# Patient Record
Sex: Male | Born: 1965 | Race: White | Hispanic: No | Marital: Married | Smoking: Never smoker
Health system: Southern US, Community
[De-identification: ages and names within clinical notes are randomized; demographics above are authoritative.]

## PROBLEM LIST (undated history)

## (undated) DIAGNOSIS — L409 Psoriasis, unspecified: Secondary | ICD-10-CM

## (undated) DIAGNOSIS — I10 Essential (primary) hypertension: Secondary | ICD-10-CM

## (undated) HISTORY — PX: CHOLECYSTECTOMY: SHX55

## (undated) HISTORY — DX: Psoriasis, unspecified: L40.9

## (undated) HISTORY — PX: APPENDECTOMY: SHX54

## (undated) HISTORY — PX: HERNIA REPAIR: SHX51

---

## 1998-02-06 ENCOUNTER — Emergency Department (HOSPITAL_COMMUNITY): Admission: EM | Admit: 1998-02-06 | Discharge: 1998-02-06 | Payer: Self-pay | Admitting: Emergency Medicine

## 1999-09-12 ENCOUNTER — Encounter: Payer: Self-pay | Admitting: Family Medicine

## 1999-09-12 ENCOUNTER — Inpatient Hospital Stay (HOSPITAL_COMMUNITY): Admission: EM | Admit: 1999-09-12 | Discharge: 1999-09-13 | Payer: Self-pay | Admitting: *Deleted

## 1999-09-12 ENCOUNTER — Encounter (INDEPENDENT_AMBULATORY_CARE_PROVIDER_SITE_OTHER): Payer: Self-pay | Admitting: Specialist

## 1999-09-12 ENCOUNTER — Encounter: Payer: Self-pay | Admitting: *Deleted

## 1999-09-16 ENCOUNTER — Encounter: Admission: RE | Admit: 1999-09-16 | Discharge: 1999-09-16 | Payer: Self-pay | Admitting: Family Medicine

## 2008-02-22 ENCOUNTER — Emergency Department: Payer: Self-pay | Admitting: Emergency Medicine

## 2008-12-27 ENCOUNTER — Emergency Department (HOSPITAL_COMMUNITY): Admission: EM | Admit: 2008-12-27 | Discharge: 2008-12-27 | Payer: Self-pay | Admitting: Emergency Medicine

## 2009-03-05 IMAGING — US US EXTREM LOW VENOUS BILAT
1 series · 17 of 24 positions shown · non-contrast
Comparison: none

REASON FOR EXAM: BILATERAL CALF PAIN; FREQUENT TRAVELER
COMMENTS:

[Series 1: us extrem low venous bilat · 17 of 79 slices shown]
[im 1/79]
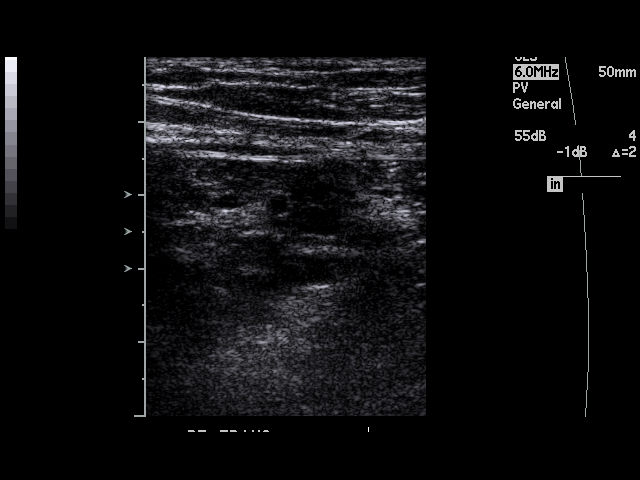
[im 7/79]
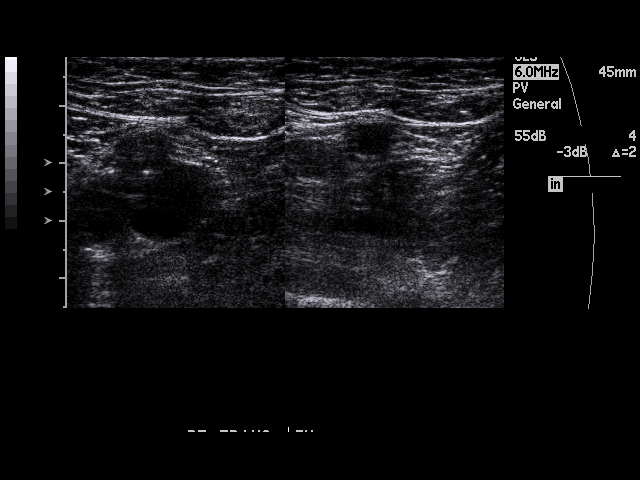
[im 11/79]
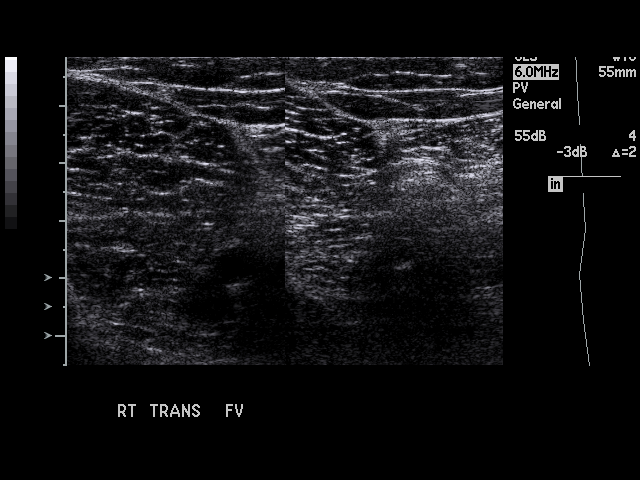
[im 14/79]
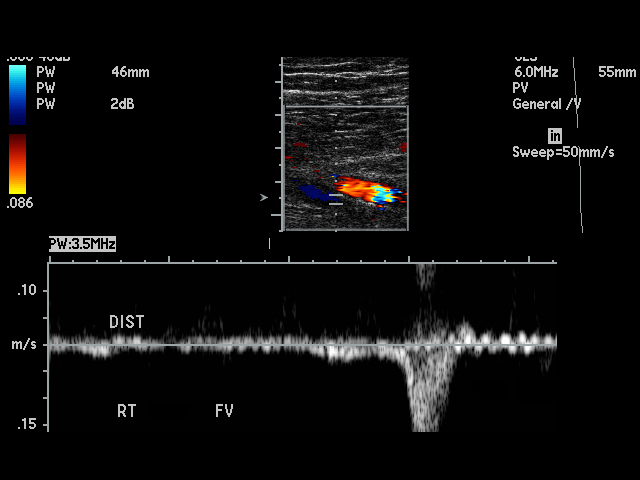
[im 21/79]
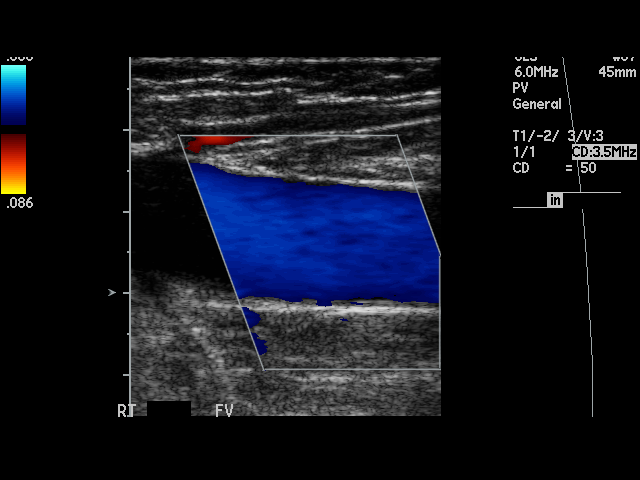
[im 24/79]
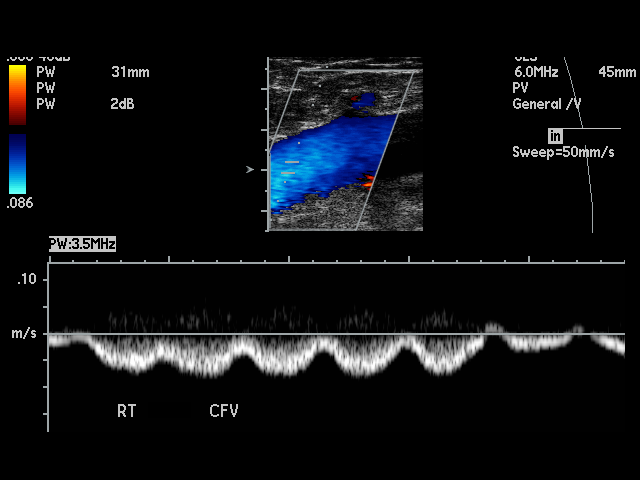
[im 31/79]
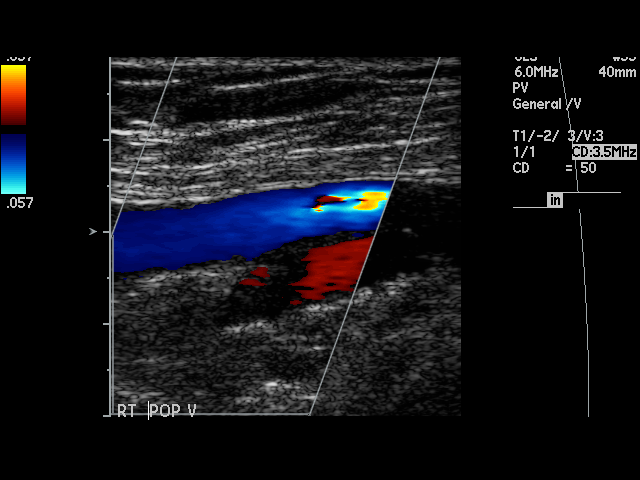
[im 34/79]
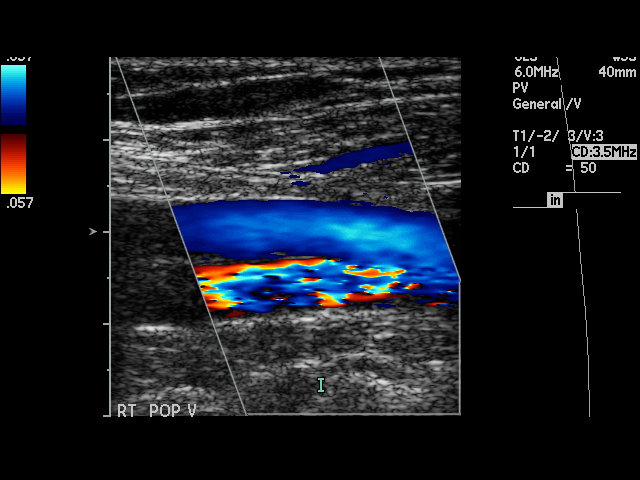
[im 41/79]
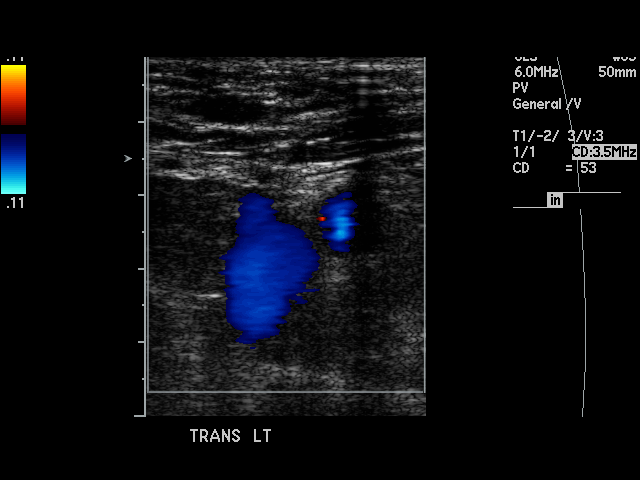
[im 45/79]
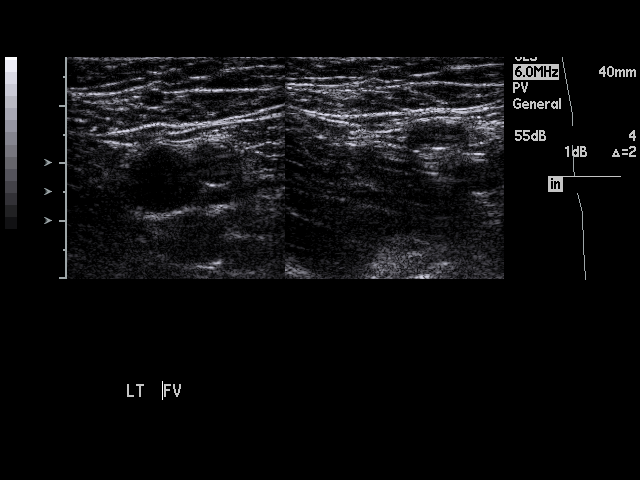
[im 48/79]
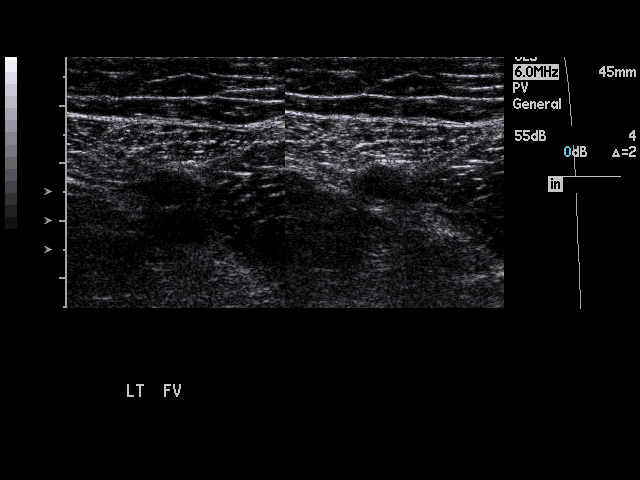
[im 55/79]
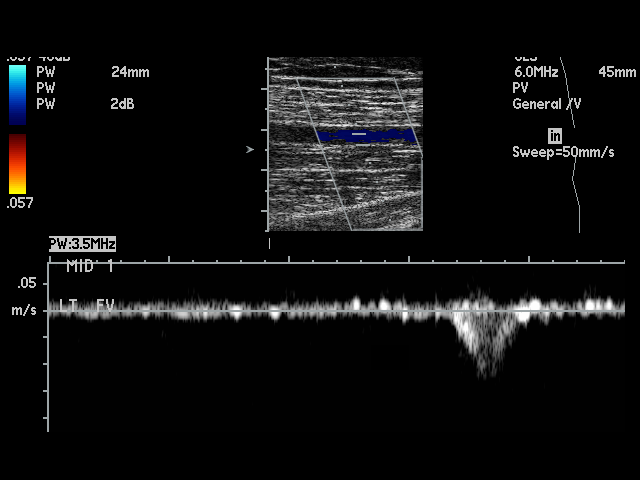
[im 58/79]
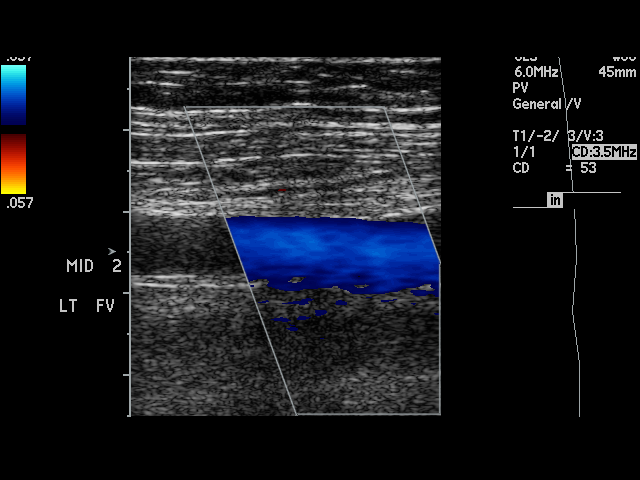
[im 65/79]
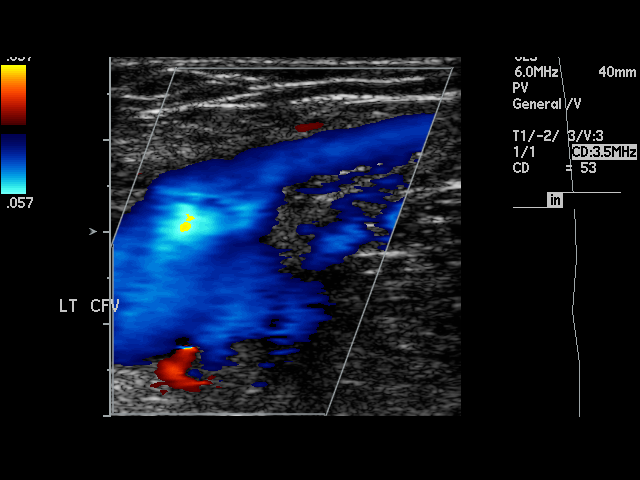
[im 68/79]
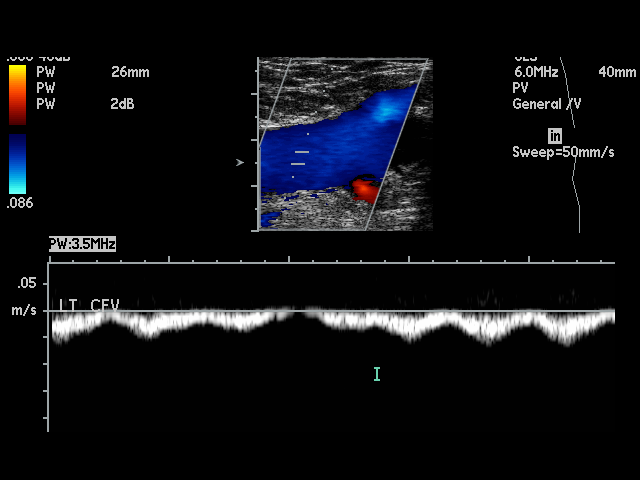
[im 72/79]
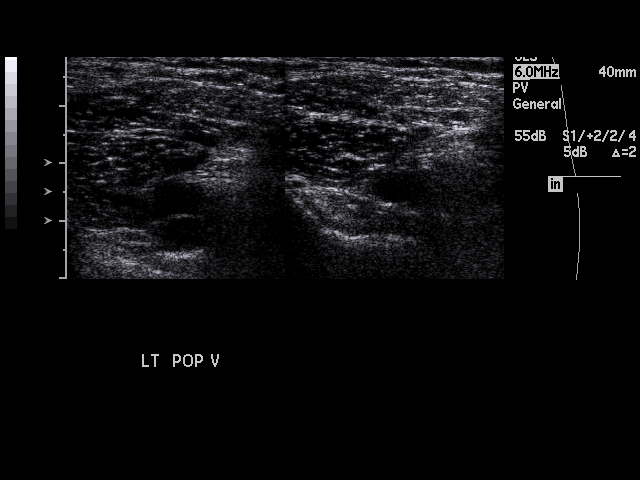
[im 79/79]
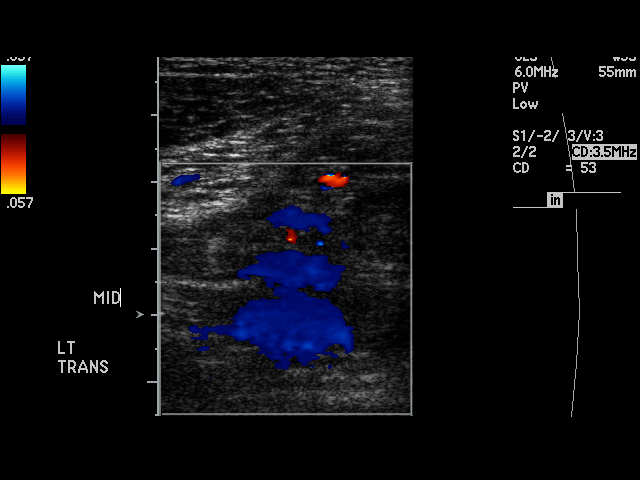

[17 of 24 positions shown; findings below may reference images not displayed]

PROCEDURE:     US  - US DOPPLER LOW EXTR BILATERAL  - February 22, 2008  [DATE]

RESULT:     Duplex, gray scale and color flow images were performed to
evaluate the deep venous structures of the RIGHT and LEFT lower extremity.

There is no evidence of increased echogenicity, non-compressibility,
abnormal color flow, gray scale or abnormal waveform within the interrogated
deep venous structures of the RIGHT and LEFT lower extremity.
IMPRESSION: 1.No sonographic evidence of a deep venous thrombus within the RIGHT and
LEFT lower extremity.

Dr. Leng of the Emergency Room was informed of these findings on 02/22/08.

## 2011-01-20 NOTE — Consult Note (Signed)
[place]  Patient:    Matthew Velasquez                        MRN: [id number] Proc. Date: 09/12/99 Adm. Date:  [id number] Attending:  McDiarmid, Leighton Roach. Dictator:   Adolph Pollack, M.D. CC:         [facility], [place], [place]                          Consultation Report  REASON FOR CONSULTATION:  Abdominal pain and gallstone.  HISTORY OF PRESENT ILLNESS:  This is a 45 year old male who has known gallstone or gallstones from CT scan a couple of years ago.  Yesterday morning he began having epigastric pain that had progressively increased throughout the day and led him to come to the emergency department early this morning.  The pain radiated through to his back and was associated with some nausea.  Questionable fever.  No jaundice. He had not had anything to eat since 8:00 p.m. last night.  He also passed a very hard stool yesterday and noticed a small amount of bright red blood per rectum.  There is no strong family history of gallbladder disease.  He has never had pain like this before.  PAST MEDICAL HISTORY: 1.  Pneumonia as a 45 year old. 2.  History of gallstone as above.  PAST SURGICAL HISTORY:  Previous operations include appendectomy.  ALLERGIES:  None.  MEDICATIONS:  None.  SOCIAL HISTORY:  Denies use of tobacco.  Occasionally has some alcohol.  He is married with three children.  Works for a [employer].  REVIEW OF SYSTEMS:  CARDIAC:  No hypertension, known heart disease, or murmur.  PULMONARY:  No asthma or tuberculosis.  GI:  No peptic ulcer disease, hepatitis, or chronic constipation.  PHYSICAL EXAMINATION:  GENERAL:  He is a well-developed, well-nourished male in no acute distress, after being given an analgesic.  VITAL SIGNS:  He is afebrile.  HEENT:  Normocephalic, atraumatic.  EOMI.  Sclerae clear.  NECK:  Supple without mass.  HEART:  Regular rate and rhythm.  LUNGS:  Equal breath sounds,  clear to auscultation.  ABDOMEN:  Soft with some epigastric tenderness to palpation.  No palpable masses present.  There is a right lower quadrant scar.  LABORATORY DATA:  WBC, amylase, lipase, liver function tests all within normal limits.  Abdominal ultrasound demonstrates a 1.7 cm gallstone impacted in the gallbladder neck.  IMPRESSION: 1.  Epigastric pain and impacted gallstone, findings consistent with early acute     cholecystitis with this continuous pain. 2.  Bright red blood per rectum, most likely related to passing hard stool and     doubt he has any major active bleeding going.  PLAN:  Laparoscopic cholecystectomy.  I did explain the procedure and the risks  including, but not limited to, bleeding, infection, bile duct injury, bile leak, possible small intestinal injury, and risks of the anesthetic.  He seemed to understand these and agrees to proceed. DD:  09/12/99 TD:  09/12/99 Job: [id number]

## 2011-01-20 NOTE — Op Note (Signed)
. [facility]  Patient:    Matthew Velasquez                        MRN: [id number] Proc. Date: 09/12/99 Adm. Date:  [id number] Attending:  McDiarmid, Leighton Roach.                           Operative Report  PREOPERATIVE DIAGNOSIS:  Acute cholecystitis.  POSTOPERATIVE DIAGNOSIS:  Acute cholecystitis.  PROCEDURE:  Laparoscopic cholecystectomy.  SURGEON:  Adolph Pollack, M.D.  ASSISTANT:  Magnus Ivan, R.N.F.A.  ANESTHESIA:  General anesthesia.  INDICATIONS:  This 45 year old male comes in with signs and symptoms of acute cholecystitis and ultrasound demonstrates an impacted 1.7 cm gallstone in the neck of the gallbladder.  He now presents for cholecystectomy.  The procedure and risks have been explained to him.  DESCRIPTION OF PROCEDURE:  He was placed supine on the operating table and general endotracheal anesthesia was administered.  His abdomen was sterilely prepped and draped.  A subumbilical incision was made incising the skin sharply.  The fascia was identified and a 1 cm incision made in the fascia and a pursestring suture placed around the fascial edges.  The peritoneal cavity was then entered bluntly and under direct vision.  A Hasson trocar was introduced into the peritoneal cavity and the pneumoperitoneum created by insufflation of CO2 gas.  Next, a laparoscope was introduced and I could see adhesions from a previous appendectomy scar in the right lower quadrant.  The gallbladder was acutely edematous and inflammed.  An 11 mm trocar was placed through the epigastrium through a similar size incision and two 5 mm trocars placed in the right abdomen.  The fundus of the gallbladder was very firm and thus was decompressed by way of  needle.  This was then grasped and retracted toward the right shoulder. Adhesions between the omentum and gallbladder were taken down bluntly with the Harmonic scalpel.  Using blunt dissection and  staying close to the gallbladder, I was able to grasp the infundibulum and then retract it laterally and identify the cystic  duct and cystic artery.  The cystic artery was clipped twice proximally and once distally and divided.  Next, the gallbladder peritoneum on the fundus was incised close to its origin and attachment to the liver.  The gallbladder was then taken down from the fundus down toward the infundibulum using the Harmonic scalpel. his was a relatively bloodless dissection.  There was a small hole made in the gallbladder with minimal bile leakage.  After dissecting the gallbladder free from the liver bed, the gallbladder was still attached to the common bile duct by the cystic duct which was readily identified at its junction with the gallbladder. It was clipped three times proximally, once distally, and then divided.  The gallbladder was then placed in an endopouch bag.  The perihepatic area was copiously irrigated with saline solution and no noted bleeding or bile leakage as apparent.  Next, the gallbladder was removed from the subumbilical port and the  fascia defect closed by tightening up and tying down the pursestring suture. Under direct vision, the right lateral trocar was removed and bleeding ensued from the trocar site.  I tried using cautery both internally and externally without luck.  I subsequently used endoclose device and placed a 2-0 Vicryl suture around the fascial edges and by tying this down, this controlled the bleeding.  Next, I removed the second 5 mm trocar and no bleeding was evident.  The pneumoperitoneum was released and the epigastric trocar removed.  The skin incisions were then closed with 4-0 Monocryl subcuticular stitches followed by Steri-Strips and sterile dressings.  He tolerated the procedure well without any apparent complications and was taken to the recovery room in satisfactory condition. DD:  09/12/99 TD:  09/12/99 Job:  [id number]

## 2011-01-20 NOTE — Discharge Summary (Signed)
[place]  Patient:    Matthew Velasquez                        MRN: [id number] Adm. Date:  [id number] Disc. Date: [id number] Attending:  McDiarmid, Leighton Roach. Dictator:   Andrey Spearman, M.D.                           Discharge Summary  DISCHARGE DIAGNOSIS:  Cholecystitis, status post cholecystectomy.  DISCHARGE MEDICATIONS:  Percocet p.r.n. for pain.  HISTORY OF PRESENT ILLNESS:  This patient is a 45 year old white male who presented with mid abdominal pain that had gradually worsened on the day of admission. The patient described the pain as severe and constant, radiating to his back.  The patient reported one small bowel movement the morning of admission with a small  amount of blood in the stool today.  The patient reported he had similar symptoms approximately two years prior, and had acute appendicitis at that time.  The patients additional history of present illness was noncontributory.  PHYSICAL EXAMINATION:  VITAL SIGNS:  Temperature 97.3.  ABDOMEN:  Soft, mildly distended, with decreased bowel sounds.  Tender in the right upper quadrant and midepigastric region.  No rebound or hepatosplenomegaly.  HOSPITAL COURSE: #1 - CHOLECYSTITIS:  The patient was admitted with diagnosis of possible peptic  ulcer disease.  GI was consulted upon admission.  The patients initial laboratory work showed his H&H to be stable.  Normal amylase and lipase at 67 and 44. LFTs were normal.  The patients coagulation panel was normal.  Surgery was consulted. Please see consult dictation.  Abdominal ultrasound was ordered, which showed a  1.7 cm gallstone impacted in the gallbladder neck.  The diagnosis of acute cholecystitis was made, and the patient had a laparoscopic cholecystectomy performed on September 12, 1999, with any complications.  Postoperatively on day #1, the patient did very well, was ambulating without difficulty, tolerating a regular diet,  was afebrile, and his incisions were clean, dry, and intact.  Therefore, he patient was deemed ready for discharge.  CONDITION ON DISCHARGE:  Stable. DD:  10/14/99 TD:  10/16/99 Job: [id number]

## 2016-08-28 ENCOUNTER — Encounter: Payer: Self-pay | Admitting: Emergency Medicine

## 2016-08-28 ENCOUNTER — Emergency Department
Admission: EM | Admit: 2016-08-28 | Discharge: 2016-08-28 | Disposition: A | Discharge status: Home / Self Care | Payer: BLUE CROSS/BLUE SHIELD | Attending: Emergency Medicine | Admitting: Emergency Medicine

## 2016-08-28 ENCOUNTER — Emergency Department: Payer: BLUE CROSS/BLUE SHIELD

## 2016-08-28 DIAGNOSIS — K529 Noninfective gastroenteritis and colitis, unspecified: Secondary | ICD-10-CM

## 2016-08-28 DIAGNOSIS — R079 Chest pain, unspecified: Secondary | ICD-10-CM | POA: Diagnosis present

## 2016-08-28 LAB — CBC
HEMATOCRIT: 49.8 % (ref 40.0–52.0)
HEMOGLOBIN: 17.4 g/dL (ref 13.0–18.0)
MCH: 28.1 pg (ref 26.0–34.0)
MCHC: 35 g/dL (ref 32.0–36.0)
MCV: 80.4 fL (ref 80.0–100.0)
Platelets: 173 10*3/uL (ref 150–440)
RBC: 6.2 MIL/uL — AB (ref 4.40–5.90)
RDW: 12.7 % (ref 11.5–14.5)
WBC: 12.6 10*3/uL — ABNORMAL HIGH (ref 3.8–10.6)

## 2016-08-28 LAB — BASIC METABOLIC PANEL
ANION GAP: 7 (ref 5–15)
BUN: 21 mg/dL — ABNORMAL HIGH (ref 6–20)
CHLORIDE: 104 mmol/L (ref 101–111)
CO2: 27 mmol/L (ref 22–32)
Calcium: 9.1 mg/dL (ref 8.9–10.3)
Creatinine, Ser: 1.1 mg/dL (ref 0.61–1.24)
GFR calc Af Amer: >60 mL/min (ref >60)
GLUCOSE: 113 mg/dL — AB (ref 65–99)
POTASSIUM: 4.1 mmol/L (ref 3.5–5.1)
Sodium: 138 mmol/L (ref 135–145)

## 2016-08-28 LAB — TROPONIN I

## 2016-08-28 MED ORDER — SODIUM CHLORIDE 0.9 % IV BOLUS (SEPSIS)
1000.0000 mL | Freq: Once | INTRAVENOUS | Status: AC
  Administered 2016-08-28: 1000 mL via INTRAVENOUS

## 2016-08-28 MED ORDER — ONDANSETRON HCL 4 MG PO TABS: 4.0000 mg | ORAL_TABLET | Freq: Three times a day (TID) | ORAL | 0 refills | Status: DC | PRN

## 2016-08-28 MED ORDER — ONDANSETRON HCL 4 MG/2ML IJ SOLN
4.0000 mg | Freq: Once | INTRAMUSCULAR | Status: AC
  Administered 2016-08-28: 4 mg via INTRAVENOUS
  Filled 2016-08-28: qty 2

## 2016-08-28 MED ORDER — PANTOPRAZOLE SODIUM 20 MG PO TBEC: 20.0000 mg | DELAYED_RELEASE_TABLET | Freq: Every day | ORAL | 1 refills | Status: DC

## 2016-08-28 NOTE — ED Provider Notes (Signed)
Time Seen: Approximately [id number]  I have reviewed the triage notes  Chief Complaint: Chest Pain and Emesis   History of Present Illness: Matthew Velasquez is a 50 y.o. male *who presents with acute onset of nausea, vomiting and diarrhea that started at 3 AM this morning. Patient states upper middle back discomfort with the vomiting episode. He describes some mild shortness of breath which is improved. He denies any anterior chest pain, arm pain, or jaw pain. He denies any focal abdominal pain. He denies any hematemesis or biliary emesis.  History reviewed. No pertinent past medical history.  There are no active problems to display for this patient.   History reviewed. No pertinent surgical history.  History reviewed. No pertinent surgical history.  Current Outpatient Rx  . Order #: [id number] Class: Historical Med  . Order #: [id number] Class: Historical Med  . Order #: [id number] Class: Print  . Order #: [id number] Class: Print    Allergies:  Patient has no known allergies.  Family History: History reviewed. No pertinent family history.  Social History: Social History  Substance Use Topics  . Smoking status: Never Smoker  . Smokeless tobacco: Current User  . Alcohol use Yes     Review of Systems:   10 point review of systems was performed and was otherwise negative:  Constitutional: No fever Eyes: No visual disturbances ENT: No sore throat, ear pain Cardiac: NoAnterior chest pain Respiratory: No shortness of breath, wheezing, or stridor Abdomen: No abdominal pain, no vomiting, No diarrhea Endocrine: No weight loss, No night sweats Extremities: No peripheral edema, cyanosis Skin: No rashes, easy bruising Neurologic: No focal weakness, trouble with speech or swollowing Urologic: No dysuria, Hematuria, or urinary frequency Patient travels frequently for living but he states he's been living locally or over the short-term last couple weeks.  Physical Exam:  ED Triage  Vitals  Enc Vitals Group     BP 08/28/16 1248 (!) 154/91     Pulse Rate 08/28/16 1248 (!) 106     Resp 08/28/16 1248 19     Temp 08/28/16 1248 97.5 F (36.4 C)     Temp Source 08/28/16 1248 Oral     SpO2 08/28/16 1248 97 %     Weight 08/28/16 1246 195 lb (88.5 kg)     Height 08/28/16 1246 5\' 11"  (1.803 m)     Head Circumference --      Peak Flow --      Pain Score --      Pain Loc --      Pain Edu? --      Excl. in [place]? --     General: Awake , Alert , and Oriented times 3; GCS 15 Head: Normal cephalic , atraumatic Eyes: Pupils equal , round, reactive to light Nose/Throat: No nasal drainage, patent upper airway without erythema or exudate.  Neck: Supple, Full range of motion, No anterior adenopathy or palpable thyroid masses Lungs: Clear to ascultation without wheezes , rhonchi, or rales Heart: Regular rate, regular rhythm without murmurs , gallops , or rubs Abdomen: Soft, non tender without rebound, guarding , or rigidity; bowel sounds positive and symmetric in all 4 quadrants. No organomegaly .        Extremities: 2 plus symmetric pulses. No edema, clubbing or cyanosis Neurologic: normal ambulation, Motor symmetric without deficits, sensory intact Skin: warm, dry, no rashes   Labs:   All laboratory work was reviewed including any pertinent negatives or positives listed below:  Labs Reviewed  CBC - Abnormal;  Notable for the following:       Result Value   WBC 12.6 (*)    RBC 6.20 (*)    All other components within normal limits  BASIC METABOLIC PANEL - Abnormal; Notable for the following:    Glucose, Bld 113 (*)    BUN 21 (*)    All other components within normal limits  TROPONIN I  Laboratory work was reviewed and showed no clinically significant abnormalities.   EKG:  ED ECG REPORT I, Daymon Larsen, the attending physician, personally viewed and interpreted this ECG.  Date: 08/28/2016 EKG Time: 1442 Rate: *91 Rhythm: normal sinus rhythm QRS Axis:  normal Intervals: normal ST/T Wave abnormalities: normal Conduction Disturbances: none Narrative Interpretation: unremarkable No acute ischemic changes   Radiology:  "Dg Chest 2 View  Result Date: 08/28/2016 CLINICAL DATA:  Upper back pain.  Vomiting.  History of pneumonia. EXAM: CHEST  2 VIEW COMPARISON:  02/22/2008 FINDINGS: The heart size and mediastinal contours are within normal limits. Both lungs are clear. The visualized skeletal structures are unremarkable. IMPRESSION: No active cardiopulmonary disease. Electronically Signed   By: Fidela Salisbury M.D.   On: 08/28/2016 14:05  "  I personally reviewed the radiologic studies    ED Course:  Patient's stay here was uneventful and the patient remained hemodynamically stable here in emergency department. Symptomatically improved with IV Zofran and was able tolerate food and fluid here in emergency department. He received initial EKG which had a lot of artifact over the anterior leads and was repeated and patient had no signs of ischemia. Given her negative troponin and a low clinical suspicion and the presence of nausea vomiting and diarrhea felt most likely this was a viral illness. Patient's differential also included Mallory-Weiss tear and/or Boerhaave syndrome. Clinical Course      Assessment: * Viral gastroenteritis  Final Clinical Impression: *  Final diagnoses:  Gastroenteritis     Plan:  Outpatient " New Prescriptions   ONDANSETRON (ZOFRAN) 4 MG TABLET    Take 1 tablet (4 mg total) by mouth every 8 (eight) hours as needed for nausea or vomiting.   PANTOPRAZOLE (PROTONIX) 20 MG TABLET    Take 1 tablet (20 mg total) by mouth daily.  "  Patient was advised to return immediately if condition worsens. Patient was advised to follow up with their primary care physician or other specialized physicians involved in their outpatient care. The patient and/or family member/power of attorney had laboratory results reviewed  at the bedside. All questions and concerns were addressed and appropriate discharge instructions were distributed by the nursing staff.             Daymon Larsen, MD 08/28/16 [phone]

## 2016-08-28 NOTE — Discharge Instructions (Signed)
Please return immediately if condition worsens. Please contact her primary physician or the physician you were given for referral. If you have any specialist physicians involved in her treatment and plan please also contact them. Thank you for using [place] [facility] emergency Department. ° °

## 2016-08-28 NOTE — ED Notes (Signed)
Pt resting in bed, family at bedside, TV turned on for pt

## 2016-08-28 NOTE — ED Triage Notes (Signed)
Pt presents to ED c/o chest pain, pain between shoulders with vomiting since 3 am .

## 2017-09-09 IMAGING — CR DG CHEST 2V
2 series · 2 of 2 positions shown · non-contrast
Comparison: 02/22/2008

CLINICAL DATA: Upper back pain.  Vomiting.  History of pneumonia.

EXAM:
CHEST  2 VIEW

[chest pa]
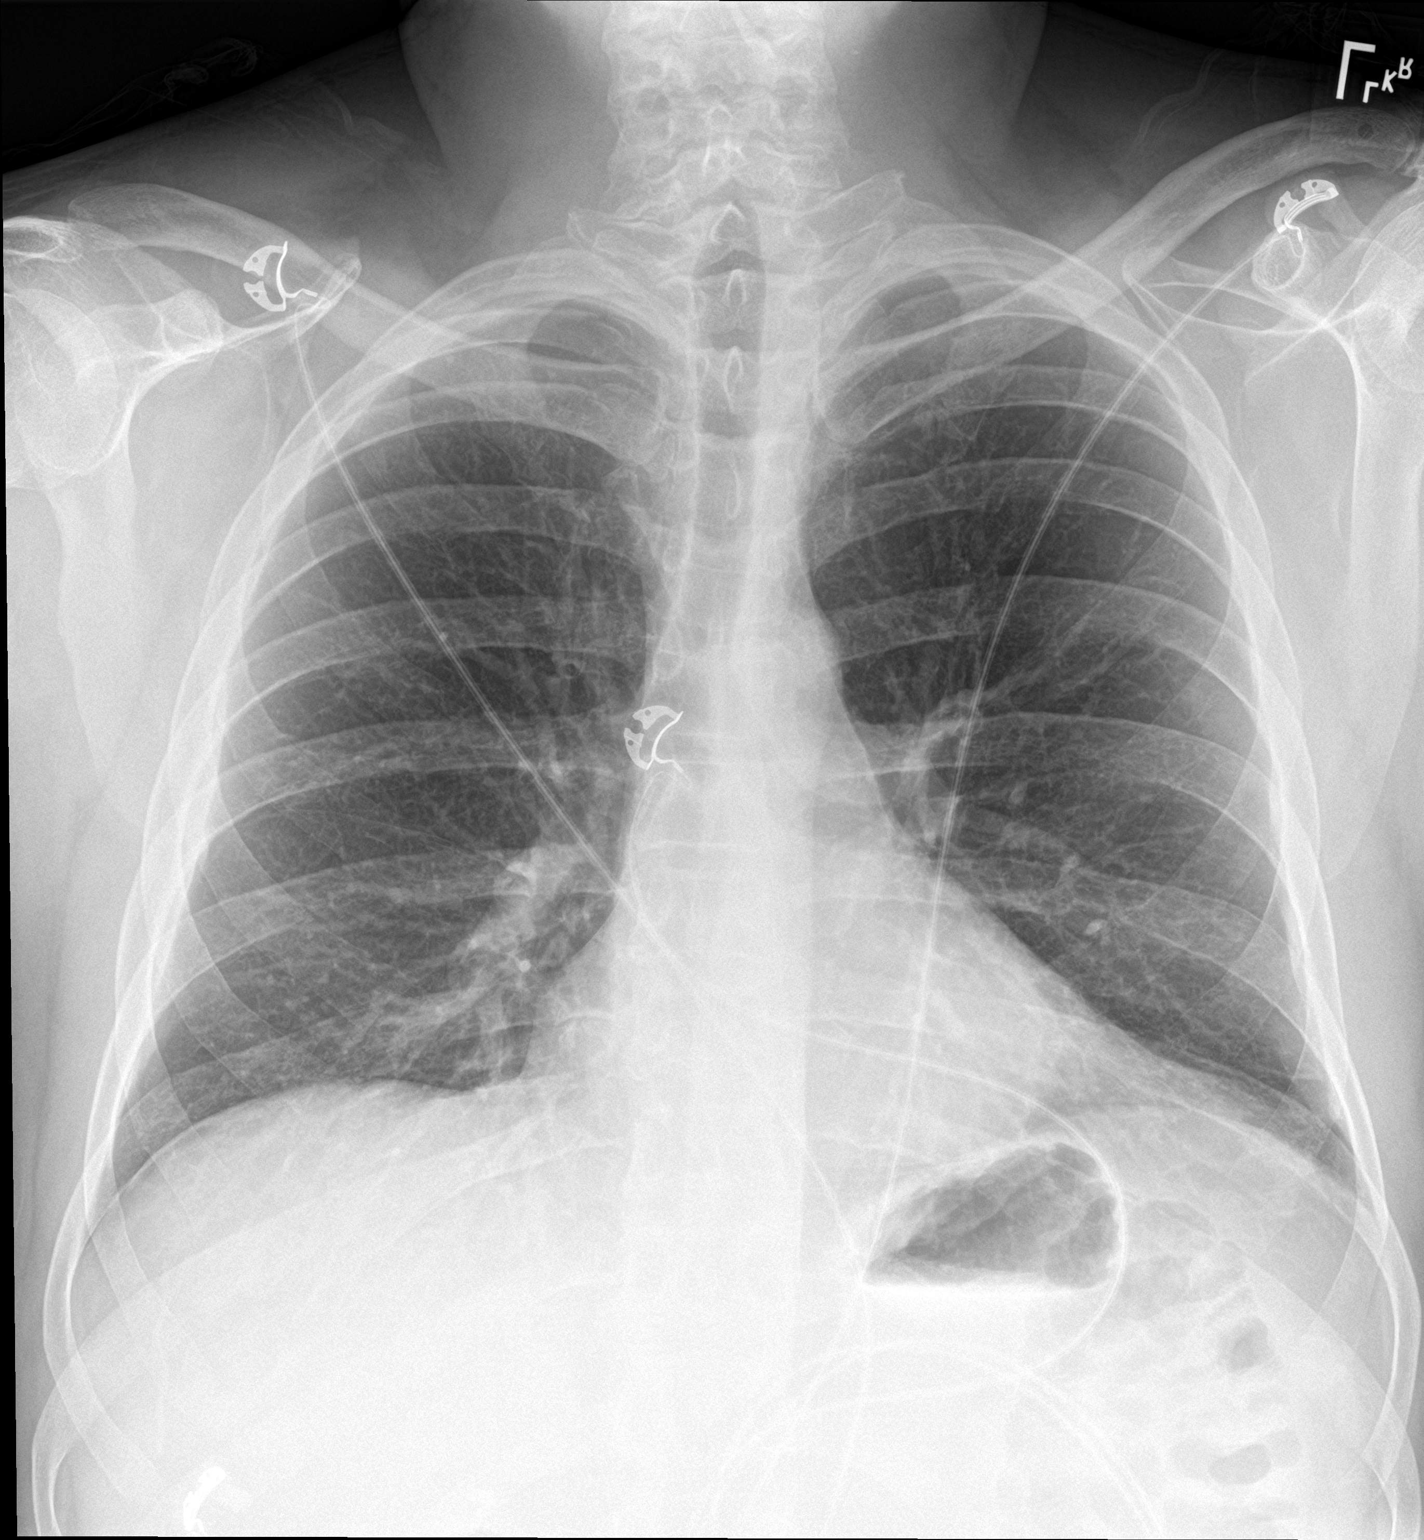

[chest lat]
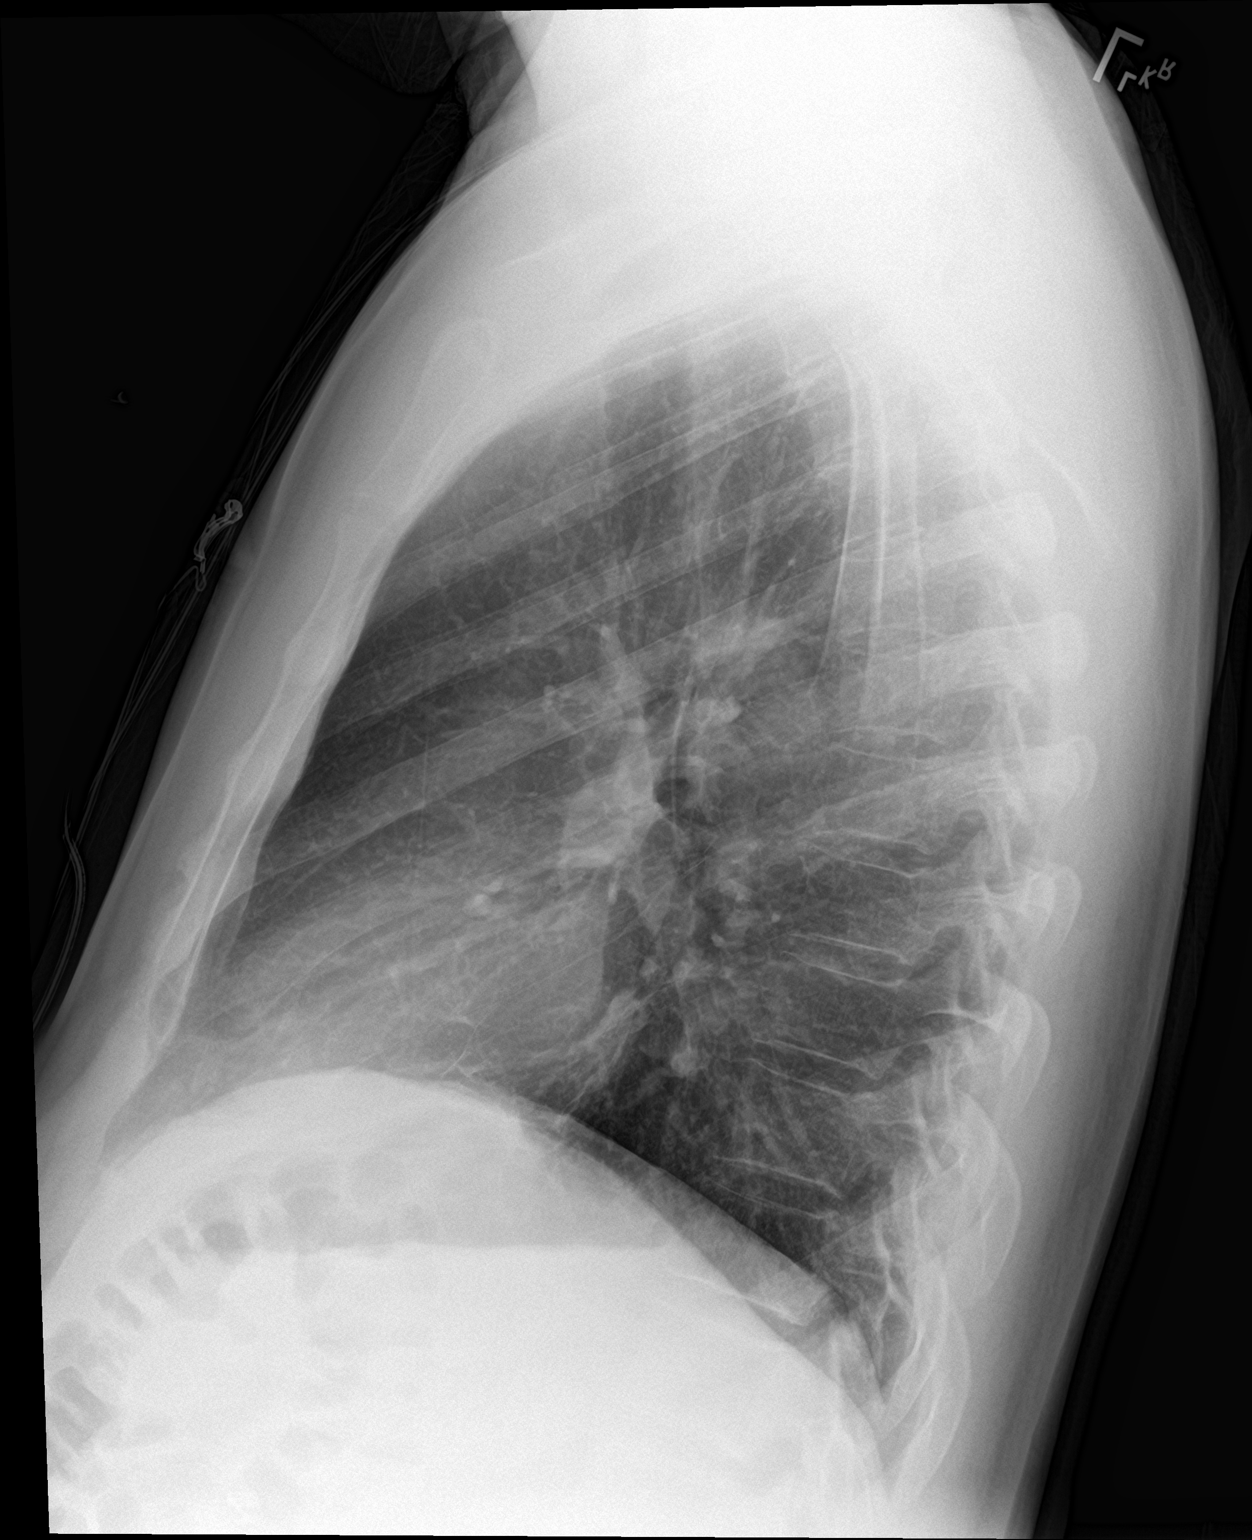

[2 of 2 positions shown; findings below may reference images not displayed]

FINDINGS: The heart size and mediastinal contours are within normal limits.
Both lungs are clear. The visualized skeletal structures are
unremarkable.
IMPRESSION: No active cardiopulmonary disease.

## 2019-08-01 ENCOUNTER — Other Ambulatory Visit: Payer: Self-pay

## 2019-08-01 ENCOUNTER — Ambulatory Visit
Admission: EM | Admit: 2019-08-01 | Discharge: 2019-08-01 | Disposition: A | Discharge status: Home / Self Care | Payer: 59 | Attending: Family Medicine | Admitting: Family Medicine

## 2019-08-01 ENCOUNTER — Encounter: Payer: Self-pay | Admitting: Emergency Medicine

## 2019-08-01 DIAGNOSIS — J069 Acute upper respiratory infection, unspecified: Secondary | ICD-10-CM

## 2019-08-01 NOTE — ED Provider Notes (Signed)
[facility]    CSN: [id number] Arrival date & time: 08/01/19  [id number]      History   Chief Complaint Chief Complaint  Patient presents with  . chest congestion  . Cough  . Sore Throat    HPI Matthew Velasquez is a 53 y.o. male.   53 yo male with a c/o chest congestion, cough, and scratchy throat. Denies any fevers, chills, chest pains, shortness of breath.    Cough Sore Throat    History reviewed. No pertinent past medical history.  There are no active problems to display for this patient.   Past Surgical History:  Procedure Laterality Date  . APPENDECTOMY    . CHOLECYSTECTOMY    . HERNIA REPAIR         Home Medications    Prior to Admission medications   Medication Sig Start Date End Date Taking? Authorizing Provider  aspirin EC 81 MG tablet Take 81 mg by mouth daily.    [provider]  ibuprofen (ADVIL,MOTRIN) 200 MG tablet Take 200 mg by mouth every 6 (six) hours as needed.    [provider]  ondansetron (ZOFRAN) 4 MG tablet Take 1 tablet (4 mg total) by mouth every 8 (eight) hours as needed for nausea or vomiting. 08/28/16   Daymon Larsen, MD  pantoprazole (PROTONIX) 20 MG tablet Take 1 tablet (20 mg total) by mouth daily. 08/28/16 08/28/17  Daymon Larsen, MD    Family History Family History  Problem Relation Age of Onset  . Other Mother        Covid  . Mental illness Mother   . Alzheimer's disease Father   . Thyroid disease Father     Social History Social History   Tobacco Use  . Smoking status: Never Smoker  . Smokeless tobacco: Current User  Substance Use Topics  . Alcohol use: Yes    Alcohol/week: 21.0 standard drinks    Types: 21 Glasses of wine per week    Comment: 3-4 glasses of wine daily  . Drug use: Never     Allergies   Patient has no known allergies.   Review of Systems Review of Systems  Respiratory: Positive for cough.      Physical Exam Triage Vital Signs ED Triage Vitals   Enc Vitals Group     BP 08/01/19 1019 (!) 145/108     Pulse Rate 08/01/19 1019 83     Resp 08/01/19 1019 18     Temp 08/01/19 1019 98.3 F (36.8 C)     Temp Source 08/01/19 1019 Oral     SpO2 08/01/19 1019 98 %     Weight 08/01/19 1020 190 lb (86.2 kg)     Height 08/01/19 1020 5\' 11"  (1.803 m)     Head Circumference --      Peak Flow --      Pain Score 08/01/19 1020 0     Pain Loc --      Pain Edu? --      Excl. in [place]? --    No data found.  Updated Vital Signs BP (!) 145/108 (BP Location: Right Arm)   Pulse 83   Temp 98.3 F (36.8 C) (Oral)   Resp 18   Ht 5\' 11"  (1.803 m)   Wt 86.2 kg   SpO2 98%   BMI 26.50 kg/m   Visual Acuity Right Eye Distance:   Left Eye Distance:   Bilateral Distance:    Right Eye Near:  Left Eye Near:    Bilateral Near:     Physical Exam Vitals signs and nursing note reviewed.  Constitutional:      General: He is not in acute distress.    Appearance: He is not toxic-appearing or diaphoretic.  Cardiovascular:     Rate and Rhythm: Normal rate.     Heart sounds: Normal heart sounds.  Pulmonary:     Effort: Pulmonary effort is normal. No respiratory distress.     Breath sounds: Normal breath sounds. No stridor. No wheezing, rhonchi or rales.  Neurological:     Mental Status: He is alert.      UC Treatments / Results  Labs (all labs ordered are listed, but only abnormal results are displayed) Labs Reviewed  NOVEL CORONAVIRUS, NAA (HOSP ORDER, SEND-OUT TO REF LAB; TAT 18-24 HRS)    EKG   Radiology No results found.  Procedures Procedures (including critical care time)  Medications Ordered in UC Medications - No data to display  Initial Impression / Assessment and Plan / UC Course  I have reviewed the triage vital signs and the nursing notes.  Pertinent labs & imaging results that were available during my care of the patient were reviewed by me and considered in my medical decision making (see chart for details).       Final Clinical Impressions(s) / UC Diagnoses   Final diagnoses:  Viral URI with cough     Discharge Instructions     Rest, fluids, over the counter tylenol/advil/cold/cough medications as needed    ED Prescriptions    None      1.diagnosis reviewed with patient 2. Recommend supportive treatment as above 3. Follow-up prn if symptoms worsen or don't improve   PDMP not reviewed this encounter.   Norval Gable, MD 08/01/19 1210

## 2019-08-01 NOTE — ED Triage Notes (Signed)
Patient in today c/o chest congestion, cough and sore throat x 3 days. Patient denies covid exposure. Patient denies fever. Patient has tried OTC Dayquil.

## 2019-08-01 NOTE — Discharge Instructions (Signed)
Rest, fluids, over the counter tylenol/advil/cold/cough medications as needed

## 2019-08-02 LAB — NOVEL CORONAVIRUS, NAA (HOSP ORDER, SEND-OUT TO REF LAB; TAT 18-24 HRS): SARS-CoV-2, NAA: NOT DETECTED

## 2020-06-03 ENCOUNTER — Encounter: Payer: Self-pay | Admitting: Emergency Medicine

## 2020-06-03 ENCOUNTER — Ambulatory Visit
Admission: EM | Admit: 2020-06-03 | Discharge: 2020-06-03 | Disposition: A | Discharge status: Home / Self Care | Payer: BC Managed Care – PPO | Attending: Internal Medicine | Admitting: Internal Medicine

## 2020-06-03 ENCOUNTER — Other Ambulatory Visit: Payer: Self-pay

## 2020-06-03 DIAGNOSIS — L03115 Cellulitis of right lower limb: Secondary | ICD-10-CM

## 2020-06-03 DIAGNOSIS — L409 Psoriasis, unspecified: Secondary | ICD-10-CM

## 2020-06-03 MED ORDER — DOXYCYCLINE HYCLATE 100 MG PO CAPS: 100.0000 mg | ORAL_CAPSULE | Freq: Two times a day (BID) | ORAL | 0 refills | Status: DC

## 2020-06-03 MED ORDER — TRIAMCINOLONE ACETONIDE 0.1 % EX CREA: 1.0000 "application " | TOPICAL_CREAM | Freq: Two times a day (BID) | CUTANEOUS | 0 refills | Status: DC

## 2020-06-03 NOTE — ED Triage Notes (Signed)
Pt c/o rash on his right lower leg. He states the rash has been there for over a year. He is also having aching in his legs. He has had aching legs before and ended up being some type of infection. Started about a week ago.

## 2020-06-03 NOTE — Discharge Instructions (Signed)
Make sure you see a dermatologist in the next month

## 2020-06-03 NOTE — ED Provider Notes (Signed)
[facility]    CSN: [id number] Arrival date & time: 06/03/20  1748      History   Chief Complaint Chief Complaint  Patient presents with  . Rash    HPI Matthew Velasquez is a 55 y.o. male who presents with rash on his R lower leg which has been there  For > 1y. The rash gets scaly and was small when it started and has grown since. Has had an cellulitis in the past before and then had been having lower leg aches x 1 week like now and is concerned. Has not seen dermatology for this rash. Has one that developed about one month ago on his L shin. Has been applying psoriasis cream over the counter which helps the scales resolve. This rash gets itchy and has picked at his skin from itching it.   History reviewed. No pertinent past medical history.  There are no problems to display for this patient.   Past Surgical History:  Procedure Laterality Date  . APPENDECTOMY    . CHOLECYSTECTOMY    . HERNIA REPAIR      Home Medications    Prior to Admission medications   Medication Sig Start Date End Date Taking? Authorizing Provider  doxycycline (VIBRAMYCIN) 100 MG capsule Take 1 capsule (100 mg total) by mouth 2 (two) times daily. 06/03/20   Rodriguez-Southworth, Sunday Spillers, PA-C  triamcinolone cream (KENALOG) 0.1 % Apply 1 application topically 2 (two) times daily. For 7-10 days and then prn recurrence 06/03/20   Rodriguez-Southworth, Sunday Spillers, PA-C  pantoprazole (PROTONIX) 20 MG tablet Take 1 tablet (20 mg total) by mouth daily. 08/28/16 06/03/20  Daymon Larsen, MD    Family History Family History  Problem Relation Age of Onset  . Other Mother        Covid  . Mental illness Mother   . Alzheimer's disease Father   . Thyroid disease Father     Social History Social History   Tobacco Use  . Smoking status: Never Smoker  . Smokeless tobacco: Current User  Vaping Use  . Vaping Use: Never used  Substance Use Topics  . Alcohol use: Yes    Alcohol/week: 21.0 standard drinks     Types: 21 Glasses of wine per week    Comment: 3-4 glasses of wine daily  . Drug use: Never     Allergies   Patient has no known allergies.   Review of Systems Review of Systems  Musculoskeletal: Positive for myalgias. Negative for arthralgias, gait problem and joint swelling.  Skin: Positive for rash.       + pruritus     Physical Exam Triage Vital Signs ED Triage Vitals  Enc Vitals Group     BP 06/03/20 1821 (!) 154/98     Pulse Rate 06/03/20 1821 75     Resp 06/03/20 1821 18     Temp 06/03/20 1821 99.2 F (37.3 C)     Temp Source 06/03/20 1821 Oral     SpO2 06/03/20 1821 99 %     Weight 06/03/20 1818 190 lb 0.6 oz (86.2 kg)     Height 06/03/20 1818 5\' 11"  (1.803 m)     Head Circumference --      Peak Flow --      Pain Score 06/03/20 1818 3     Pain Loc --      Pain Edu? --      Excl. in [place]? --    No data found.  Updated  Vital Signs BP (!) 154/98 (BP Location: Left Arm)   Pulse 75   Temp 99.2 F (37.3 C) (Oral)   Resp 18   Ht 5\' 11"  (1.803 m)   Wt 190 lb 0.6 oz (86.2 kg)   SpO2 99%   BMI 26.50 kg/m   Visual Acuity Right Eye Distance:   Left Eye Distance:   Bilateral Distance:    Right Eye Near:   Left Eye Near:    Bilateral Near:     Physical Exam Vitals and nursing note reviewed.  Constitutional:      General: He is not in acute distress.    Appearance: He is not toxic-appearing.  HENT:     Head: Normocephalic.     Right Ear: External ear normal.     Left Ear: External ear normal.  Eyes:     General: No scleral icterus.    Conjunctiva/sclera: Conjunctivae normal.  Pulmonary:     Effort: Pulmonary effort is normal.  Musculoskeletal:        General: Normal range of motion.     Cervical back: Neck supple.     Comments: Does not have calf tenderness, cords or swelling bilaterally  Skin:    General: Skin is warm and dry.     Findings: Rash present.     Comments: R lower LEG- has 2/3 of front of his R shin with brownish pink, raised  thick skin rash which is not scaly, but has several papular open scabs and sores. The anterior ankle has similar color rash, but is dry and without sores, and extends to the dorsum on proximal foot. This rash is hot to palpation.  L LOWER LEG- has similar coloration of rash mentioned above in he center of his shin but only about 5x3 cm patch with few scabs. This rash is not hot  Neurological:     Mental Status: He is alert and oriented to person, place, and time.     Gait: Gait normal.  Psychiatric:        Mood and Affect: Mood normal.        Behavior: Behavior normal.        Thought Content: Thought content normal.        Judgment: Judgment normal.    UC Treatments / Results  Labs (all labs ordered are listed, but only abnormal results are displayed) Labs Reviewed - No data to display  EKG   Radiology No results found.  Procedures Procedures (including critical care time)  Medications Ordered in UC Medications - No data to display  Initial Impression / Assessment and Plan / UC Course  I have reviewed the triage vital signs and the nursing notes. I believe he may have psoriasis and from itching it so much has thick skin scarring and is getting cellulitis.  I placed him on triamcinolone cream as noted and doxy.  Needs to FU with derm   Final Clinical Impressions(s) / UC Diagnoses   Final diagnoses:  Cellulitis of right lower extremity  Psoriasis     Discharge Instructions     Make sure you see a dermatologist in the next month    ED Prescriptions    Medication Sig Dispense Auth. Provider   triamcinolone cream (KENALOG) 0.1 % Apply 1 application topically 2 (two) times daily. For 7-10 days and then prn recurrence 80 g Rodriguez-Southworth, Sunday Spillers, PA-C   doxycycline (VIBRAMYCIN) 100 MG capsule Take 1 capsule (100 mg total) by mouth 2 (two) times daily. 20 capsule  Rodriguez-Southworth, Sunday Spillers, PA-C     PDMP not reviewed this encounter.   Shelby Mattocks, [place] 06/03/20 1928

## 2020-07-30 ENCOUNTER — Other Ambulatory Visit: Payer: Self-pay

## 2020-07-30 ENCOUNTER — Encounter: Payer: Self-pay | Admitting: Emergency Medicine

## 2020-07-30 ENCOUNTER — Ambulatory Visit
Admission: EM | Admit: 2020-07-30 | Discharge: 2020-07-30 | Disposition: A | Discharge status: Home / Self Care | Payer: BC Managed Care – PPO | Source: Home / Self Care | Attending: Emergency Medicine | Admitting: Emergency Medicine

## 2020-07-30 ENCOUNTER — Ambulatory Visit
Admission: RE | Admit: 2020-07-30 | Discharge: 2020-07-30 | Disposition: A | Discharge status: Home / Self Care | Payer: BC Managed Care – PPO | Source: Ambulatory Visit | Attending: Emergency Medicine | Admitting: Emergency Medicine

## 2020-07-30 DIAGNOSIS — Z760 Encounter for issue of repeat prescription: Secondary | ICD-10-CM | POA: Insufficient documentation

## 2020-07-30 DIAGNOSIS — L409 Psoriasis, unspecified: Secondary | ICD-10-CM | POA: Diagnosis not present

## 2020-07-30 DIAGNOSIS — M79661 Pain in right lower leg: Secondary | ICD-10-CM | POA: Diagnosis not present

## 2020-07-30 DIAGNOSIS — M79662 Pain in left lower leg: Secondary | ICD-10-CM | POA: Insufficient documentation

## 2020-07-30 DIAGNOSIS — M7989 Other specified soft tissue disorders: Secondary | ICD-10-CM | POA: Diagnosis not present

## 2020-07-30 DIAGNOSIS — M79605 Pain in left leg: Secondary | ICD-10-CM | POA: Diagnosis not present

## 2020-07-30 LAB — BASIC METABOLIC PANEL
Anion gap: 6 (ref 5–15)
BUN: 12 mg/dL (ref 6–20)
CO2: 28 mmol/L (ref 22–32)
Calcium: 9.2 mg/dL (ref 8.9–10.3)
Chloride: 104 mmol/L (ref 98–111)
Creatinine, Ser: 1.04 mg/dL (ref 0.61–1.24)
GFR, Estimated: >60 mL/min (ref >60)
Glucose, Bld: 104 mg/dL — ABNORMAL HIGH (ref 70–99)
Potassium: 4.4 mmol/L (ref 3.5–5.1)
Sodium: 138 mmol/L (ref 135–145)

## 2020-07-30 MED ORDER — TRIAMCINOLONE ACETONIDE 0.1 % EX CREA: 1.0000 "application " | TOPICAL_CREAM | Freq: Two times a day (BID) | CUTANEOUS | 0 refills | Status: DC

## 2020-07-30 NOTE — ED Provider Notes (Signed)
HPI  SUBJECTIVE:  Matthew Velasquez is a 54 y.o. male who presents with 2 months of dull, achy, constant, daily bilateral deep calf pain that is getting worse.  It is worse on the left more so than the right.  Denies other muscular cramping elsewhere. Patient was seen here in September with a bilateral lower extremity rash.  Thought to have psoriasis with a secondary cellulitis, was placed on triamcinolone cream and doxycycline.  Advised to follow-up with dermatology.  He states that he was having calf pain at that time.  No calf swelling, lower extremity edema, distal color changes, change in physical activity, thigh pain, knee pain, chest pain, shortness of breath, hemoptysis, recent immobilization, surgery preceding the symptoms.  He has never had symptoms like this before.  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  He has tried [employer] with some improvement in his symptoms.  Symptoms are worse with standing.  It is not associated with walking.  He states that his psoriasis when he was seen here before is painful, but has gotten significantly better with the triamcinolone cream.  He is requesting a refill of this.  Past medical history negative for DVT, PE, peripheral arterial disease, peripheral vascular disease, diabetes, hypertension, hypercoagulability, smoking, coronary disease, hypercholesterolemia, cancer, liver disease, chronic kidney disease.  PMD: He is establishing care at [facility] primary care on December 13.  History reviewed. No pertinent past medical history.  Past Surgical History:  Procedure Laterality Date  . APPENDECTOMY    . CHOLECYSTECTOMY    . HERNIA REPAIR      Family History  Problem Relation Age of Onset  . Other Mother        Covid  . Mental illness Mother   . Alzheimer's disease Father   . Thyroid disease Father     Social History   Tobacco Use  . Smoking status: Never Smoker  . Smokeless tobacco: Current User  Vaping Use  . Vaping Use: Never used    Substance Use Topics  . Alcohol use: Yes    Alcohol/week: 21.0 standard drinks    Types: 21 Glasses of wine per week    Comment: 3-4 glasses of wine daily  . Drug use: Never    No current facility-administered medications for this encounter.  Current Outpatient Medications:  .  triamcinolone (KENALOG) 0.1 %, Apply 1 application topically 2 (two) times daily. For 7-10 days and then prn recurrence, Disp: 80 g, Rfl: 0  No Known Allergies   ROS  As noted in HPI.   Physical Exam  BP (!) 142/102 (BP Location: Left Arm)   Pulse 78   Temp 98.7 F (37.1 C) (Oral)   SpO2 100%   Constitutional: Well developed, well nourished, no acute distress Eyes:  EOMI, conjunctiva normal bilaterally HENT: Normocephalic, atraumatic,mucus membranes moist Respiratory: Normal inspiratory effort Cardiovascular: Normal rate GI: nondistended skin: Psoriasis bilateral lower extremities with excoriations no crusting.           Musculoskeletal: Right calf 38.5 cm, left calf 37 cm.  Calves nontender.  No popliteal tenderness.  No palpable cord.  No tenderness along the medial thigh. No edema. DP 2+ and equal bilaterally.  Cap refill less than 2 seconds. Neurologic: Alert & oriented x 3, no focal neuro deficits Psychiatric: Speech and behavior appropriate   ED Course   Medications - No data to display  Orders Placed This Encounter  Procedures  . US Venous Img Lower Bilateral (DVT)  No auth needed per Heather R/o dvt.  Pain and swelling bilateral legs    Standing Status:   Future    Number of Occurrences:   1    Standing Expiration Date:   07/30/2021    Order Specific Question:   Reason for Exam (SYMPTOM  OR DIAGNOSIS REQUIRED)    Answer:   r/o DVT    Order Specific Question:   Preferred imaging location?    Answer:   [facility]    Order Specific Question:   Call Results- Best Contact Number?    Answer:   [phone] / hold patient  . Basic metabolic panel    Standing  Status:   Standing    Number of Occurrences:   1    Results for orders placed or performed during the hospital encounter of 07/30/20 (from the past 24 hour(s))  Basic metabolic panel     Status: Abnormal   Collection Time: 07/30/20 10:41 AM  Result Value Ref Range   Sodium 138 135 - 145 mmol/L   Potassium 4.4 3.5 - 5.1 mmol/L   Chloride 104 98 - 111 mmol/L   CO2 28 22 - 32 mmol/L   Glucose, Bld 104 (H) 70 - 99 mg/dL   BUN 12 6 - 20 mg/dL   Creatinine, Ser 1.04 0.61 - 1.24 mg/dL   Calcium 9.2 8.9 - 10.3 mg/dL   GFR, Estimated >60 >60 mL/min   Anion gap 6 5 - 15   US Venous Img Lower Bilateral (DVT)  Result Date: 07/30/2020 CLINICAL DATA:  Pain and swelling bilateral lower extremities EXAM: BILATERAL LOWER EXTREMITY VENOUS DOPPLER ULTRASOUND TECHNIQUE: Gray-scale sonography with compression, as well as color and duplex ultrasound, were performed to evaluate the deep venous system(s) from the level of the common femoral vein through the popliteal and proximal calf veins. COMPARISON:  None. FINDINGS: VENOUS Normal compressibility of the common femoral, superficial femoral, and popliteal veins, as well as the visualized calf veins. Visualized portions of profunda femoral vein and great saphenous vein unremarkable. No filling defects to suggest DVT on grayscale or color Doppler imaging. Doppler waveforms show normal direction of venous flow, normal respiratory plasticity and response to augmentation. OTHER None. Limitations: none IMPRESSION: No evidence of DVT in both lower extremities. Electronically Signed   By: Macy Mis M.D.   On: 07/30/2020 13:49    ED Clinical Impression  1. Pain in both lower legs   2. Psoriasis   3. Medication refill      ED Assessment/Plan  Previous records reviewed.  As noted in HPI.  will check renal function, in case I have to start him on Xarelto.  Obtaining outpatient bilateral lower extremity ultrasound to rule out DVT.  Doubt compartment syndrome.   Peripheral vascular /peripheral arterial disease also in the differential, but he has good pulses and good cap refill.  We will give him vascular surgery's information.  Dr. Trula Slade on call.  His psoriasis seems stable.  He will need to follow-up with a dermatologist for this.  Will refill triamcinolone cream.  will have him start some compression stockings, have him follow-up with Dr. Army Melia in December for further evaluation and referral to vascular if necessary.  He is establishing care with her around December 13- 15th.   BMP normal.  No DVT per radiology report.  Discussed MDM, treatment plan, and plan for follow-up with patient. Discussed sn/sx that should prompt return to the ED. patient agrees with plan.   Meds ordered this encounter  Medications  . triamcinolone (KENALOG) 0.1 %    Sig: Apply 1 application topically 2 (two) times daily. For 7-10 days and then prn recurrence    Dispense:  80 g    Refill:  0    *This clinic note was created using [employer]. Therefore, there may be occasional mistakes despite careful proofreading.   ?    Melynda Ripple, MD 07/31/20 [phone]

## 2020-07-30 NOTE — Discharge Instructions (Addendum)
I am checking some lab work to make sure it is okay to start you on Xarelto in case you have a blood clot in your leg.  I have ordered an ultrasound on both of your legs to be done today.  Please remain in the ultrasound area until I talk with the tech or the radiologist.  I will then talk to you about your results.  Have them call me at [phone] when your ultrasound is done.  In the meantime, you can try some compression stockings, elevation, follow-up with Dr. Trula Slade if not getting any better in several weeks.  Follow-up with Dr. Army Melia as scheduled.

## 2020-07-30 NOTE — ED Triage Notes (Signed)
Pt c/o bilateral lower leg pain x 2 months. Pt states the triamcinolone cream he was prescribed last time did help. Pt states yesterday he could not tolerate to stand for long periods of time. Pt states the pain is worst in the left leg.

## 2020-08-13 ENCOUNTER — Other Ambulatory Visit: Payer: Self-pay | Admitting: Internal Medicine

## 2020-08-16 ENCOUNTER — Other Ambulatory Visit: Payer: Self-pay

## 2020-08-16 ENCOUNTER — Encounter: Payer: Self-pay | Admitting: Internal Medicine

## 2020-08-16 ENCOUNTER — Ambulatory Visit: Payer: BC Managed Care – PPO | Admitting: Internal Medicine

## 2020-08-16 VITALS — BP 138/98 | HR 96 | Temp 98.3°F | Ht 71.0 in | Wt 194.0 lb

## 2020-08-16 DIAGNOSIS — M791 Myalgia, unspecified site: Secondary | ICD-10-CM | POA: Diagnosis not present

## 2020-08-16 DIAGNOSIS — R21 Rash and other nonspecific skin eruption: Secondary | ICD-10-CM

## 2020-08-16 DIAGNOSIS — R030 Elevated blood-pressure reading, without diagnosis of hypertension: Secondary | ICD-10-CM | POA: Diagnosis not present

## 2020-08-16 NOTE — Progress Notes (Signed)
Date:  08/16/2020   Name:  Matthew Velasquez   DOB:  03-10-1966   MRN:  [id number]   Chief Complaint: Establish Care and Leg Pain (X 3-4 months, both legs, hurts worse when laying down, doesn't feel like its joint pain, more like muscle pain, pain comes and goes, kenalog cream  from UC pt said it works good )  Leg Pain  There was no injury mechanism. The pain is present in the left leg and right leg. The quality of the pain is described as aching (in calfs - worse at night). The pain is mild. The pain has been fluctuating since onset. Pertinent negatives include no inability to bear weight, loss of motion, loss of sensation, muscle weakness, numbness or tingling. He reports no foreign bodies (DVT ruled out by UC) present. Nothing aggravates the symptoms. He has tried NSAIDs for the symptoms. The treatment provided mild relief.  Rash This is a chronic problem. The problem has been gradually improving since onset. The affected locations include the right lower leg, left lower leg, right ankle and right foot. The rash is characterized by dryness, itchiness and scaling. He was exposed to nothing. Pertinent negatives include no cough, fatigue, fever or shortness of breath. Past treatments include topical steroids. The treatment provided moderate relief.    Lab Results  Component Value Date   CREATININE 1.04 07/30/2020   BUN 12 07/30/2020   NA 138 07/30/2020   K 4.4 07/30/2020   CL 104 07/30/2020   CO2 28 07/30/2020   No results found for: CHOL, HDL, LDLCALC, LDLDIRECT, TRIG, CHOLHDL No results found for: TSH No results found for: HGBA1C Lab Results  Component Value Date   WBC 12.6 (H) 08/28/2016   HGB 17.4 08/28/2016   HCT 49.8 08/28/2016   MCV 80.4 08/28/2016   PLT 173 08/28/2016   No results found for: ALT, AST, GGT, ALKPHOS, BILITOT   Review of Systems  Constitutional: Negative for chills, fatigue and fever.  Respiratory: Negative for cough, chest tightness, shortness of breath  and wheezing.   Cardiovascular: Negative for chest pain, palpitations and leg swelling.  Gastrointestinal: Negative for abdominal pain.  Genitourinary: Negative for dysuria.  Musculoskeletal: Positive for myalgias. Negative for arthralgias and gait problem.  Skin: Positive for rash.  Neurological: Negative for dizziness, tingling, numbness and headaches.  Psychiatric/Behavioral: Negative for dysphoric mood and sleep disturbance. The patient is not nervous/anxious.     There are no problems to display for this patient.   No Known Allergies  Past Surgical History:  Procedure Laterality Date  . APPENDECTOMY    . CHOLECYSTECTOMY    . HERNIA REPAIR      Social History   Tobacco Use  . Smoking status: Never Smoker  . Smokeless tobacco: Current User  Vaping Use  . Vaping Use: Never used  Substance Use Topics  . Alcohol use: Yes    Alcohol/week: 21.0 standard drinks    Types: 21 Glasses of wine per week    Comment: 3-4 glasses of wine daily  . Drug use: Never     Medication list has been reviewed and updated.  Current Meds  Medication Sig  . cetirizine (ZYRTEC) 10 MG tablet Take by mouth.  . triamcinolone (KENALOG) 0.1 % Apply 1 application topically 2 (two) times daily. For 7-10 days and then prn recurrence    PHQ 2/9 Scores 08/16/2020  PHQ - 2 Score 0  PHQ- 9 Score 0    GAD 7 : Generalized  Anxiety Score 08/16/2020  Nervous, Anxious, on Edge 0  Control/stop worrying 0  Worry too much - different things 0  Trouble relaxing 0  Restless 0  Easily annoyed or irritable 0  Afraid - awful might happen 0  Total GAD 7 Score 0    BP Readings from Last 3 Encounters:  08/16/20 (!) 138/98  07/30/20 (!) 142/102  06/03/20 (!) 154/98    Physical Exam Vitals and nursing note reviewed.  Constitutional:      General: He is not in acute distress.    Appearance: Normal appearance. He is well-developed.  HENT:     Head: Normocephalic and atraumatic.  Eyes:      Extraocular Movements: Extraocular movements intact.     Pupils: Pupils are equal, round, and reactive to light.  Neck:     Vascular: No carotid bruit.  Cardiovascular:     Rate and Rhythm: Normal rate and regular rhythm.     Pulses: Normal pulses.     Heart sounds: No murmur heard.   Pulmonary:     Effort: Pulmonary effort is normal. No respiratory distress.     Breath sounds: No wheezing or rhonchi.  Musculoskeletal:        General: Normal range of motion.     Cervical back: Normal range of motion.     Right lower leg: No swelling, lacerations, tenderness or bony tenderness. No edema.     Left lower leg: No swelling, lacerations, tenderness or bony tenderness. No edema.  Lymphadenopathy:     Cervical: No cervical adenopathy.  Skin:    General: Skin is warm and dry.     Capillary Refill: Capillary refill takes less than 2 seconds.     Findings: No rash.          Comments: Bilateral anterior shin lichenification - worse on right ankle and foot. Excoriations noted; no odor or drainage or bleeding  Neurological:     General: No focal deficit present.     Mental Status: He is alert and oriented to person, place, and time.  Psychiatric:        Attention and Perception: Attention normal.        Mood and Affect: Mood and affect and mood normal.        Behavior: Behavior normal.     Wt Readings from Last 3 Encounters:  08/16/20 194 lb (88 kg)  06/03/20 190 lb 0.6 oz (86.2 kg)  08/01/19 190 lb (86.2 kg)    BP (!) 138/98   Pulse 96   Temp 98.3 F (36.8 C) (Oral)   Ht 5\' 11"  (1.803 m)   Wt 194 lb (88 kg)   SpO2 96%   BMI 27.06 kg/m   Assessment and Plan: 1. Myalgia Unclear cause - exam is normal except for rash Will obtain a few screening tests to evaluate - CBC with Differential/Platelet - Magnesium - VITAMIN D 25 Hydroxy (Vit-D Deficiency, Fractures) - CK (Creatine Kinase)  2. Rash and nonspecific skin eruption Continue TAC cream daily Dermatology evaluation -  Ambulatory referral to Dermatology  3. Elevated blood pressure reading DASH diet given Discussed beginning regular exercise Cut back on alcohol intake   Partially dictated using Editor, commissioning. Any errors are unintentional.  Halina Maidens, MD [facility]  08/16/2020

## 2020-08-16 NOTE — Patient Instructions (Signed)
DASH Eating Plan DASH stands for "Dietary Approaches to Stop Hypertension." The DASH eating plan is a healthy eating plan that has been shown to reduce high blood pressure (hypertension). It may also reduce your risk for type 2 diabetes, heart disease, and stroke. The DASH eating plan may also help with weight loss. What are tips for following this plan?  General guidelines  Avoid eating more than 2,300 mg (milligrams) of salt (sodium) a day. If you have hypertension, you may need to reduce your sodium intake to 1,500 mg a day.  Limit alcohol intake to no more than 1 drink a day for nonpregnant women and 2 drinks a day for men. One drink equals 12 oz of beer, 5 oz of wine, or 1 oz of hard liquor.  Work with your health care provider to maintain a healthy body weight or to lose weight. Ask what an ideal weight is for you.  Get at least 30 minutes of exercise that causes your heart to beat faster (aerobic exercise) most days of the week. Activities may include walking, swimming, or biking.  Work with your health care provider or diet and nutrition specialist (dietitian) to adjust your eating plan to your individual calorie needs. Reading food labels   Check food labels for the amount of sodium per serving. Choose foods with less than 5 percent of the Daily Value of sodium. Generally, foods with less than 300 mg of sodium per serving fit into this eating plan.  To find whole grains, look for the word "whole" as the first word in the ingredient list. Shopping  Buy products labeled as "low-sodium" or "no salt added."  Buy fresh foods. Avoid canned foods and premade or frozen meals. Cooking  Avoid adding salt when cooking. Use salt-free seasonings or herbs instead of table salt or sea salt. Check with your health care provider or pharmacist before using salt substitutes.  Do not fry foods. Cook foods using healthy methods such as baking, boiling, grilling, and broiling instead.  Cook with  heart-healthy oils, such as olive, canola, soybean, or sunflower oil. Meal planning  Eat a balanced diet that includes: ? 5 or more servings of fruits and vegetables each day. At each meal, try to fill half of your plate with fruits and vegetables. ? Up to 6-8 servings of whole grains each day. ? Less than 6 oz of lean meat, poultry, or fish each day. A 3-oz serving of meat is about the same size as a deck of cards. One egg equals 1 oz. ? 2 servings of low-fat dairy each day. ? A serving of nuts, seeds, or beans 5 times each week. ? Heart-healthy fats. Healthy fats called Omega-3 fatty acids are found in foods such as flaxseeds and coldwater fish, like sardines, salmon, and mackerel.  Limit how much you eat of the following: ? Canned or prepackaged foods. ? Food that is high in trans fat, such as fried foods. ? Food that is high in saturated fat, such as fatty meat. ? Sweets, desserts, sugary drinks, and other foods with added sugar. ? Full-fat dairy products.  Do not salt foods before eating.  Try to eat at least 2 vegetarian meals each week.  Eat more home-cooked food and less restaurant, buffet, and fast food.  When eating at a restaurant, ask that your food be prepared with less salt or no salt, if possible. What foods are recommended? The items listed may not be a complete list. Talk with your dietitian about   what dietary choices are best for you. Grains Whole-grain or whole-wheat bread. Whole-grain or whole-wheat pasta. Brown rice. Oatmeal. Quinoa. Bulgur. Whole-grain and low-sodium cereals. Pita bread. Low-fat, low-sodium crackers. Whole-wheat flour tortillas. Vegetables Fresh or frozen vegetables (raw, steamed, roasted, or grilled). Low-sodium or reduced-sodium tomato and vegetable juice. Low-sodium or reduced-sodium tomato sauce and tomato paste. Low-sodium or reduced-sodium canned vegetables. Fruits All fresh, dried, or frozen fruit. Canned fruit in natural juice (without  added sugar). Meat and other protein foods Skinless chicken or turkey. Ground chicken or turkey. Pork with fat trimmed off. Fish and seafood. Egg whites. Dried beans, peas, or lentils. Unsalted nuts, nut butters, and seeds. Unsalted canned beans. Lean cuts of beef with fat trimmed off. Low-sodium, lean deli meat. Dairy Low-fat (1%) or fat-free (skim) milk. Fat-free, low-fat, or reduced-fat cheeses. Nonfat, low-sodium ricotta or cottage cheese. Low-fat or nonfat yogurt. Low-fat, low-sodium cheese. Fats and oils Soft margarine without trans fats. Vegetable oil. Low-fat, reduced-fat, or light mayonnaise and salad dressings (reduced-sodium). Canola, safflower, olive, soybean, and sunflower oils. Avocado. Seasoning and other foods Herbs. Spices. Seasoning mixes without salt. Unsalted popcorn and pretzels. Fat-free sweets. What foods are not recommended? The items listed may not be a complete list. Talk with your dietitian about what dietary choices are best for you. Grains Baked goods made with fat, such as croissants, muffins, or some breads. Dry pasta or rice meal packs. Vegetables Creamed or fried vegetables. Vegetables in a cheese sauce. Regular canned vegetables (not low-sodium or reduced-sodium). Regular canned tomato sauce and paste (not low-sodium or reduced-sodium). Regular tomato and vegetable juice (not low-sodium or reduced-sodium). Pickles. Olives. Fruits Canned fruit in a light or heavy syrup. Fried fruit. Fruit in cream or butter sauce. Meat and other protein foods Fatty cuts of meat. Ribs. Fried meat. Bacon. Sausage. Bologna and other processed lunch meats. Salami. Fatback. Hotdogs. Bratwurst. Salted nuts and seeds. Canned beans with added salt. Canned or smoked fish. Whole eggs or egg yolks. Chicken or turkey with skin. Dairy Whole or 2% milk, cream, and half-and-half. Whole or full-fat cream cheese. Whole-fat or sweetened yogurt. Full-fat cheese. Nondairy creamers. Whipped toppings.  Processed cheese and cheese spreads. Fats and oils Butter. Stick margarine. Lard. Shortening. Ghee. Bacon fat. Tropical oils, such as coconut, palm kernel, or palm oil. Seasoning and other foods Salted popcorn and pretzels. Onion salt, garlic salt, seasoned salt, table salt, and sea salt. Worcestershire sauce. Tartar sauce. Barbecue sauce. Teriyaki sauce. Soy sauce, including reduced-sodium. Steak sauce. Canned and packaged gravies. Fish sauce. Oyster sauce. Cocktail sauce. Horseradish that you find on the shelf. Ketchup. Mustard. Meat flavorings and tenderizers. Bouillon cubes. Hot sauce and Tabasco sauce. Premade or packaged marinades. Premade or packaged taco seasonings. Relishes. Regular salad dressings. Where to find more information:  National Heart, Lung, and Blood Institute: www.nhlbi.nih.gov  American Heart Association: www.heart.org Summary  The DASH eating plan is a healthy eating plan that has been shown to reduce high blood pressure (hypertension). It may also reduce your risk for type 2 diabetes, heart disease, and stroke.  With the DASH eating plan, you should limit salt (sodium) intake to 2,300 mg a day. If you have hypertension, you may need to reduce your sodium intake to 1,500 mg a day.  When on the DASH eating plan, aim to eat more fresh fruits and vegetables, whole grains, lean proteins, low-fat dairy, and heart-healthy fats.  Work with your health care provider or diet and nutrition specialist (dietitian) to adjust your eating plan to your   individual calorie needs. This information is not intended to replace advice given to you by your health care provider. Make sure you discuss any questions you have with your health care provider. Document Revised: 08/03/2017 Document Reviewed: 08/14/2016 Elsevier Patient Education  2020 Elsevier Inc.  

## 2020-08-17 LAB — CBC WITH DIFFERENTIAL/PLATELET
Basophils Absolute: 0.1 10*3/uL (ref 0.0–0.2)
Basos: 1 %
EOS (ABSOLUTE): 0.1 10*3/uL (ref 0.0–0.4)
Eos: 1 %
Hematocrit: 50.9 % (ref 37.5–51.0)
Hemoglobin: 17.3 g/dL (ref 13.0–17.7)
Immature Grans (Abs): 0 10*3/uL (ref 0.0–0.1)
Immature Granulocytes: 0 %
Lymphocytes Absolute: 1.4 10*3/uL (ref 0.7–3.1)
Lymphs: 14 %
MCH: 27.8 pg (ref 26.6–33.0)
MCHC: 34 g/dL (ref 31.5–35.7)
MCV: 82 fL (ref 79–97)
Monocytes Absolute: 0.9 10*3/uL (ref 0.1–0.9)
Monocytes: 8 %
Neutrophils Absolute: 7.9 10*3/uL — ABNORMAL HIGH (ref 1.4–7.0)
Neutrophils: 76 %
Platelets: 195 10*3/uL (ref 150–450)
RBC: 6.22 x10E6/uL — ABNORMAL HIGH (ref 4.14–5.80)
RDW: 12.7 % (ref 11.6–15.4)
WBC: 10.4 10*3/uL (ref 3.4–10.8)

## 2020-08-17 LAB — MAGNESIUM: Magnesium: 2.3 mg/dL (ref 1.6–2.3)

## 2020-08-17 LAB — VITAMIN D 25 HYDROXY (VIT D DEFICIENCY, FRACTURES): Vit D, 25-Hydroxy: 6.2 ng/mL — ABNORMAL LOW (ref 30.0–100.0)

## 2020-08-17 LAB — CK: Total CK: 80 U/L (ref 41–331)

## 2020-09-07 DIAGNOSIS — L280 Lichen simplex chronicus: Secondary | ICD-10-CM | POA: Diagnosis not present

## 2020-10-27 DIAGNOSIS — D485 Neoplasm of uncertain behavior of skin: Secondary | ICD-10-CM | POA: Diagnosis not present

## 2020-10-27 DIAGNOSIS — L280 Lichen simplex chronicus: Secondary | ICD-10-CM | POA: Diagnosis not present

## 2020-11-25 DIAGNOSIS — E559 Vitamin D deficiency, unspecified: Secondary | ICD-10-CM | POA: Insufficient documentation

## 2020-11-25 NOTE — Progress Notes (Signed)
Date:  11/26/2020   Name:  Matthew Velasquez   DOB:  [birth year]   MRN:  [id number]   Chief Complaint: Annual Exam  Matthew Velasquez is a 55 y.o. male who presents today for his Complete Annual Exam. He feels well. He reports exercising none. He reports he is sleeping poorly.   Colonoscopy: 03/2001  Immunization History  Administered Date(s) Administered  . Influenza,inj,Quad PF,6+ Mos 09/14/2018  . Influenza-Unspecified 06/21/2020  . PFIZER(Purple Top)SARS-COV-2 Vaccination 11/26/2019, 12/17/2019, 06/21/2020    Rash This is a chronic problem. The affected locations include the right foot. The rash is characterized by redness and itchiness. Pertinent negatives include no diarrhea, fatigue or shortness of breath. Past treatments include topical steroids (eczema per Dermatology).   HTN - he has had elevated BP over the past few years - borderline but never normal. He does drink coffee all day but does not take any cold or sinus medications.  Lab Results  Component Value Date   CREATININE 1.04 07/30/2020   BUN 12 07/30/2020   NA 138 07/30/2020   K 4.4 07/30/2020   CL 104 07/30/2020   CO2 28 07/30/2020   No results found for: CHOL, HDL, LDLCALC, LDLDIRECT, TRIG, CHOLHDL No results found for: TSH No results found for: HGBA1C Lab Results  Component Value Date   WBC 10.4 08/16/2020   HGB 17.3 08/16/2020   HCT 50.9 08/16/2020   MCV 82 08/16/2020   PLT 195 08/16/2020   Last vitamin D Lab Results  Component Value Date   VD25OH 6.2 (L) 08/16/2020    No results found for: ALT, AST, GGT, ALKPHOS, BILITOT   Review of Systems  Constitutional: Negative for appetite change, chills, diaphoresis, fatigue and unexpected weight change.  HENT: Negative for hearing loss, tinnitus, trouble swallowing and voice change.   Eyes: Negative for visual disturbance.  Respiratory: Negative for choking, shortness of breath and wheezing.   Cardiovascular: Negative for chest pain, palpitations and  leg swelling.  Gastrointestinal: Negative for abdominal pain, blood in stool, constipation and diarrhea.  Genitourinary: Negative for difficulty urinating, dysuria and frequency.  Musculoskeletal: Negative for arthralgias, back pain and myalgias.  Skin: Negative for color change and rash.  Neurological: Negative for dizziness, syncope and headaches.  Hematological: Negative for adenopathy.  Psychiatric/Behavioral: Negative for dysphoric mood and sleep disturbance.    Patient Active Problem List   Diagnosis Date Noted  . Vitamin D deficiency 11/25/2020  . Rash and nonspecific skin eruption 08/16/2020  . Myalgia 08/16/2020  . Elevated blood pressure reading 08/16/2020    No Known Allergies  Past Surgical History:  Procedure Laterality Date  . APPENDECTOMY    . CHOLECYSTECTOMY    . HERNIA REPAIR      Social History   Tobacco Use  . Smoking status: Never Smoker  . Smokeless tobacco: Current User  Vaping Use  . Vaping Use: Never used  Substance Use Topics  . Alcohol use: Yes    Alcohol/week: 49.0 standard drinks    Types: 28 Glasses of wine, 21 Shots of liquor per week    Comment: 1 bottle wine/1/4 of a fifth of liquor daily  . Drug use: Never     Medication list has been reviewed and updated.  Current Meds  Medication Sig  . cetirizine (ZYRTEC) 10 MG tablet Take by mouth.  . clobetasol ointment (TEMOVATE) 1.01 % Apply 1 application topically 2 (two) times daily.  Marland Kitchen VITAMIN D PO Take by mouth daily.  PHQ 2/9 Scores 11/26/2020 08/16/2020  PHQ - 2 Score 0 0  PHQ- 9 Score 0 0    GAD 7 : Generalized Anxiety Score 11/26/2020 08/16/2020  Nervous, Anxious, on Edge 0 0  Control/stop worrying 0 0  Worry too much - different things 0 0  Trouble relaxing 0 0  Restless 0 0  Easily annoyed or irritable 0 0  Afraid - awful might happen 0 0  Total GAD 7 Score 0 0    BP Readings from Last 3 Encounters:  11/26/20 (!) 138/98  08/16/20 (!) 138/98  07/30/20 (!) 142/102     Physical Exam Vitals and nursing note reviewed.  Constitutional:      Appearance: Normal appearance. He is well-developed.  HENT:     Head: Normocephalic.     Right Ear: Tympanic membrane, ear canal and external ear normal.     Left Ear: Tympanic membrane, ear canal and external ear normal.     Nose: Nose normal.  Eyes:     Conjunctiva/sclera: Conjunctivae normal.     Pupils: Pupils are equal, round, and reactive to light.  Neck:     Thyroid: No thyromegaly.     Vascular: No carotid bruit.  Cardiovascular:     Rate and Rhythm: Normal rate and regular rhythm.     Heart sounds: Normal heart sounds.  Pulmonary:     Effort: Pulmonary effort is normal.     Breath sounds: Normal breath sounds. No wheezing.  Chest:  Breasts:     Right: No mass.     Left: No mass.    Abdominal:     General: Bowel sounds are normal.     Palpations: Abdomen is soft.     Tenderness: There is no abdominal tenderness.  Musculoskeletal:        General: Normal range of motion.     Cervical back: Normal range of motion and neck supple.  Lymphadenopathy:     Cervical: No cervical adenopathy.  Skin:    General: Skin is warm and dry.  Neurological:     Mental Status: He is alert and oriented to person, place, and time.     Deep Tendon Reflexes: Reflexes are normal and symmetric.  Psychiatric:        Attention and Perception: Attention normal.        Mood and Affect: Mood normal.        Thought Content: Thought content normal.     Wt Readings from Last 3 Encounters:  11/26/20 194 lb (88 kg)  08/16/20 194 lb (88 kg)  06/03/20 190 lb 0.6 oz (86.2 kg)    BP (!) 138/98   Pulse 76   Temp 98.1 F (36.7 C) (Oral)   Ht 5\' 11"  (1.803 m)   Wt 194 lb (88 kg)   SpO2 96%   BMI 27.06 kg/m   Assessment and Plan: 1. Annual physical exam Exam is normal except for weight and BP. Encourage regular exercise and appropriate dietary changes. - CBC with Differential/Platelet - Comprehensive metabolic  panel - Lipid panel  2. Colon cancer screening Due for screening - Ambulatory referral to Gastroenterology  3. Prostate cancer screening DRE deferred - PSA  4. Need for hepatitis C screening test - Hepatitis C antibody  5. Vitamin D deficiency Feeling much better now on supplements and getting some sun daily Recommend continuing supplementation indefinitely - VITAMIN D 25 Hydroxy (Vit-D Deficiency, Fractures)  6. Essential hypertension Needs to begin medication DASH diet Follow up in  4 months to recheck; sooner if problems - olmesartan (BENICAR) 20 MG tablet; Take 1 tablet (20 mg total) by mouth daily.  Dispense: 90 tablet; Refill: 1   Partially dictated using Editor, commissioning. Any errors are unintentional.  Halina Maidens, MD [facility]  11/26/2020

## 2020-11-26 ENCOUNTER — Other Ambulatory Visit: Payer: Self-pay

## 2020-11-26 ENCOUNTER — Encounter: Payer: Self-pay | Admitting: Internal Medicine

## 2020-11-26 ENCOUNTER — Ambulatory Visit (INDEPENDENT_AMBULATORY_CARE_PROVIDER_SITE_OTHER): Payer: BC Managed Care – PPO | Admitting: Internal Medicine

## 2020-11-26 VITALS — BP 138/98 | HR 76 | Temp 98.1°F | Ht 71.0 in | Wt 194.0 lb

## 2020-11-26 DIAGNOSIS — Z1159 Encounter for screening for other viral diseases: Secondary | ICD-10-CM

## 2020-11-26 DIAGNOSIS — Z1211 Encounter for screening for malignant neoplasm of colon: Secondary | ICD-10-CM

## 2020-11-26 DIAGNOSIS — I10 Essential (primary) hypertension: Secondary | ICD-10-CM | POA: Diagnosis not present

## 2020-11-26 DIAGNOSIS — E559 Vitamin D deficiency, unspecified: Secondary | ICD-10-CM | POA: Diagnosis not present

## 2020-11-26 DIAGNOSIS — Z125 Encounter for screening for malignant neoplasm of prostate: Secondary | ICD-10-CM | POA: Diagnosis not present

## 2020-11-26 DIAGNOSIS — Z0000 Encounter for general adult medical examination without abnormal findings: Secondary | ICD-10-CM

## 2020-11-26 LAB — POCT URINALYSIS DIPSTICK
Bilirubin, UA: NEGATIVE
Blood, UA: NEGATIVE
Glucose, UA: NEGATIVE
Ketones, UA: NEGATIVE
Leukocytes, UA: NEGATIVE
Nitrite, UA: NEGATIVE
Protein, UA: NEGATIVE
Spec Grav, UA: 1.01 (ref 1.010–1.025)
Urobilinogen, UA: 0.2 E.U./dL
pH, UA: 6 (ref 5.0–8.0)

## 2020-11-26 MED ORDER — OLMESARTAN MEDOXOMIL 20 MG PO TABS: 20.0000 mg | ORAL_TABLET | Freq: Every day | ORAL | 1 refills | Status: DC

## 2020-11-26 NOTE — Patient Instructions (Signed)

## 2020-11-27 LAB — COMPREHENSIVE METABOLIC PANEL
ALT: 27 IU/L (ref 0–44)
AST: 25 IU/L (ref 0–40)
Albumin/Globulin Ratio: 1.6 (ref 1.2–2.2)
Albumin: 4.7 g/dL (ref 3.8–4.9)
Alkaline Phosphatase: 90 IU/L (ref 44–121)
BUN/Creatinine Ratio: 10 (ref 9–20)
BUN: 11 mg/dL (ref 6–24)
Bilirubin Total: 1 mg/dL (ref 0.0–1.2)
CO2: 23 mmol/L (ref 20–29)
Calcium: 9.6 mg/dL (ref 8.7–10.2)
Chloride: 99 mmol/L (ref 96–106)
Creatinine, Ser: 1.06 mg/dL (ref 0.76–1.27)
Globulin, Total: 2.9 g/dL (ref 1.5–4.5)
Glucose: 89 mg/dL (ref 65–99)
Potassium: 4.4 mmol/L (ref 3.5–5.2)
Sodium: 139 mmol/L (ref 134–144)
Total Protein: 7.6 g/dL (ref 6.0–8.5)
eGFR: 83 mL/min/{1.73_m2} (ref >59)

## 2020-11-27 LAB — CBC WITH DIFFERENTIAL/PLATELET
Basophils Absolute: 0.1 10*3/uL (ref 0.0–0.2)
Basos: 1 %
EOS (ABSOLUTE): 0.2 10*3/uL (ref 0.0–0.4)
Eos: 2 %
Hematocrit: 49.7 % (ref 37.5–51.0)
Hemoglobin: 16.6 g/dL (ref 13.0–17.7)
Immature Grans (Abs): 0 10*3/uL (ref 0.0–0.1)
Immature Granulocytes: 0 %
Lymphocytes Absolute: 1.8 10*3/uL (ref 0.7–3.1)
Lymphs: 25 %
MCH: 28 pg (ref 26.6–33.0)
MCHC: 33.4 g/dL (ref 31.5–35.7)
MCV: 84 fL (ref 79–97)
Monocytes Absolute: 0.7 10*3/uL (ref 0.1–0.9)
Monocytes: 10 %
Neutrophils Absolute: 4.3 10*3/uL (ref 1.4–7.0)
Neutrophils: 62 %
Platelets: 197 10*3/uL (ref 150–450)
RBC: 5.93 x10E6/uL — ABNORMAL HIGH (ref 4.14–5.80)
RDW: 12.3 % (ref 11.6–15.4)
WBC: 7.1 10*3/uL (ref 3.4–10.8)

## 2020-11-27 LAB — LIPID PANEL
Chol/HDL Ratio: 3.8 ratio (ref 0.0–5.0)
Cholesterol, Total: 218 mg/dL — ABNORMAL HIGH (ref 100–199)
HDL: 58 mg/dL (ref >39)
LDL Chol Calc (NIH): 137 mg/dL — ABNORMAL HIGH (ref 0–99)
Triglycerides: 132 mg/dL (ref 0–149)
VLDL Cholesterol Cal: 23 mg/dL (ref 5–40)

## 2020-11-27 LAB — PSA: Prostate Specific Ag, Serum: 1.5 ng/mL (ref 0.0–4.0)

## 2020-11-27 LAB — VITAMIN D 25 HYDROXY (VIT D DEFICIENCY, FRACTURES): Vit D, 25-Hydroxy: 39.4 ng/mL (ref 30.0–100.0)

## 2020-11-27 LAB — HEPATITIS C ANTIBODY: Hep C Virus Ab: <0.1 s/co ratio (ref 0.0–0.9)

## 2020-11-28 ENCOUNTER — Encounter: Payer: Self-pay | Admitting: Internal Medicine

## 2020-11-28 DIAGNOSIS — E785 Hyperlipidemia, unspecified: Secondary | ICD-10-CM | POA: Insufficient documentation

## 2020-12-10 ENCOUNTER — Telehealth (INDEPENDENT_AMBULATORY_CARE_PROVIDER_SITE_OTHER): Payer: Self-pay | Admitting: Gastroenterology

## 2020-12-10 ENCOUNTER — Other Ambulatory Visit: Payer: Self-pay

## 2020-12-10 DIAGNOSIS — Z1211 Encounter for screening for malignant neoplasm of colon: Secondary | ICD-10-CM

## 2020-12-10 MED ORDER — PEG 3350-KCL-NA BICARB-NACL 420 G PO SOLR: 4000.0000 mL | Freq: Once | ORAL | 0 refills | Status: AC

## 2020-12-10 NOTE — Progress Notes (Signed)
Gastroenterology Pre-Procedure Review  Request Date: Monday 01/03/21 Requesting Physician: Dr. Allen Norris  PATIENT REVIEW QUESTIONS: The patient responded to the following health history questions as indicated:    1. Are you having any GI issues? no 2. Do you have a personal history of Polyps? no 3. Do you have a family history of Colon Cancer or Polyps? no 4. Diabetes Mellitus? no 5. Joint replacements in the past 12 months?no 6. Major health problems in the past 3 months?no 7. Any artificial heart valves, MVP, or defibrillator?no    MEDICATIONS & ALLERGIES:    Patient reports the following regarding taking any anticoagulation/antiplatelet therapy:   Plavix, Coumadin, Eliquis, Xarelto, Lovenox, Pradaxa, Brilinta, or Effient? no Aspirin? no  Patient confirms/reports the following medications:  Current Outpatient Medications  Medication Sig Dispense Refill  . cetirizine (ZYRTEC) 10 MG tablet Take by mouth.    . clobetasol ointment (TEMOVATE) 3.15 % Apply 1 application topically 2 (two) times daily.    Marland Kitchen olmesartan (BENICAR) 20 MG tablet Take 1 tablet (20 mg total) by mouth daily. 90 tablet 1  . polyethylene glycol-electrolytes (NULYTELY) 420 g solution Take 4,000 mLs by mouth once for 1 dose. Fill container to the fill line with clear liquid.  Mix well.  Drink 8 oz every 30 minutes until entire contents have been completed.  Do not eat or drink anything 4 hours prior to colonoscopy. 4000 mL 0  . VITAMIN D PO Take by mouth daily.     No current facility-administered medications for this visit.    Patient confirms/reports the following allergies:  No Known Allergies  Orders Placed This Encounter  Procedures  . Procedural/ Surgical Case Request: COLONOSCOPY WITH PROPOFOL    Standing Status:   Standing    Number of Occurrences:   1    Order Specific Question:   Pre-op diagnosis    Answer:   screening colonoscopy    Order Specific Question:   CPT Code    Answer:   40086     AUTHORIZATION INFORMATION Primary Insurance: 1D#: Group #:  Secondary Insurance: 1D#: Group #:  SCHEDULE INFORMATION: Date: Monday 01/03/21 Time: Location:[facility]

## 2020-12-23 ENCOUNTER — Other Ambulatory Visit: Payer: Self-pay

## 2020-12-23 ENCOUNTER — Encounter: Payer: Self-pay | Admitting: Gastroenterology

## 2020-12-31 NOTE — Discharge Instructions (Signed)

## 2021-01-03 ENCOUNTER — Ambulatory Visit: Payer: BC Managed Care – PPO | Admitting: Anesthesiology

## 2021-01-03 ENCOUNTER — Encounter: Payer: Self-pay | Admitting: Gastroenterology

## 2021-01-03 ENCOUNTER — Ambulatory Visit
Admission: RE | Admit: 2021-01-03 | Discharge: 2021-01-03 | Disposition: A | Discharge status: Home / Self Care | Payer: BC Managed Care – PPO | Attending: Gastroenterology | Admitting: Gastroenterology

## 2021-01-03 ENCOUNTER — Other Ambulatory Visit: Payer: Self-pay

## 2021-01-03 ENCOUNTER — Encounter
Admission: RE | Disposition: A | Discharge status: Home / Self Care | Payer: Self-pay | Source: Home / Self Care | Attending: Gastroenterology

## 2021-01-03 DIAGNOSIS — I10 Essential (primary) hypertension: Secondary | ICD-10-CM | POA: Diagnosis not present

## 2021-01-03 DIAGNOSIS — F17290 Nicotine dependence, other tobacco product, uncomplicated: Secondary | ICD-10-CM | POA: Insufficient documentation

## 2021-01-03 DIAGNOSIS — Z820 Family history of epilepsy and other diseases of the nervous system: Secondary | ICD-10-CM | POA: Insufficient documentation

## 2021-01-03 DIAGNOSIS — Z818 Family history of other mental and behavioral disorders: Secondary | ICD-10-CM | POA: Insufficient documentation

## 2021-01-03 DIAGNOSIS — K635 Polyp of colon: Secondary | ICD-10-CM | POA: Diagnosis not present

## 2021-01-03 DIAGNOSIS — Z79899 Other long term (current) drug therapy: Secondary | ICD-10-CM | POA: Insufficient documentation

## 2021-01-03 DIAGNOSIS — Z8349 Family history of other endocrine, nutritional and metabolic diseases: Secondary | ICD-10-CM | POA: Insufficient documentation

## 2021-01-03 DIAGNOSIS — K640 First degree hemorrhoids: Secondary | ICD-10-CM | POA: Diagnosis not present

## 2021-01-03 DIAGNOSIS — D120 Benign neoplasm of cecum: Secondary | ICD-10-CM | POA: Insufficient documentation

## 2021-01-03 DIAGNOSIS — Z1211 Encounter for screening for malignant neoplasm of colon: Secondary | ICD-10-CM | POA: Diagnosis not present

## 2021-01-03 HISTORY — DX: Essential (primary) hypertension: I10

## 2021-01-03 HISTORY — PX: POLYPECTOMY: SHX5525

## 2021-01-03 HISTORY — PX: COLONOSCOPY WITH PROPOFOL: SHX5780

## 2021-01-03 SURGERY — COLONOSCOPY WITH PROPOFOL
Anesthesia: General | Site: Rectum

## 2021-01-03 MED ORDER — LACTATED RINGERS IV SOLN: INTRAVENOUS | Status: DC

## 2021-01-03 MED ORDER — SODIUM CHLORIDE 0.9 % IV SOLN
INTRAVENOUS | Status: DC
Start: 2021-01-03 — End: 2021-01-03

## 2021-01-03 MED ORDER — ACETAMINOPHEN 325 MG PO TABS: 325.0000 mg | ORAL_TABLET | ORAL | Status: DC | PRN

## 2021-01-03 MED ORDER — PROPOFOL 10 MG/ML IV BOLUS
INTRAVENOUS | Status: DC | PRN
  Administered 2021-01-03 (×8): 20 mg via INTRAVENOUS
  Administered 2021-01-03: 100 mg via INTRAVENOUS

## 2021-01-03 MED ORDER — ACETAMINOPHEN 160 MG/5ML PO SOLN: 325.0000 mg | ORAL | Status: DC | PRN

## 2021-01-03 MED ORDER — LIDOCAINE HCL (CARDIAC) PF 100 MG/5ML IV SOSY
PREFILLED_SYRINGE | INTRAVENOUS | Status: DC | PRN
  Administered 2021-01-03: 20 mg via INTRAVENOUS

## 2021-01-03 MED ORDER — ONDANSETRON HCL 4 MG/2ML IJ SOLN: 4.0000 mg | Freq: Once | INTRAMUSCULAR | Status: DC | PRN

## 2021-01-03 MED ORDER — STERILE WATER FOR IRRIGATION IR SOLN: Status: DC | PRN

## 2021-01-03 SURGICAL SUPPLY — 8 items
GOWN CVR UNV OPN BCK APRN NK (MISCELLANEOUS) ×2 IMPLANT
GOWN ISOL THUMB LOOP REG UNIV (MISCELLANEOUS) ×4
KIT PRC NS LF DISP ENDO (KITS) ×1 IMPLANT
KIT PROCEDURE OLYMPUS (KITS) ×2
MANIFOLD NEPTUNE II (INSTRUMENTS) ×2 IMPLANT
SNARE COLD EXACTO (MISCELLANEOUS) ×1 IMPLANT
TRAP ETRAP POLY (MISCELLANEOUS) ×1 IMPLANT
WATER STERILE IRR 250ML POUR (IV SOLUTION) ×2 IMPLANT

## 2021-01-03 NOTE — Op Note (Signed)
[facility] Gastroenterology Patient Name: Matthew Velasquez Procedure Date: 01/03/2021 9:32 AM MRN: [id number] Account #: [id number] Date of Birth: [birth year] Admit Type: Outpatient Age: 55 Room: [facility] OR ROOM 01 Gender: Male Note Status: Finalized Procedure:             Colonoscopy Indications:           Screening for colorectal malignant neoplasm Providers:             Lucilla Lame MD, MD Referring MD:          Halina Maidens, MD (Referring MD) Medicines:             Propofol per Anesthesia Complications:         No immediate complications. Procedure:             Pre-Anesthesia Assessment:                        - Prior to the procedure, a History and Physical was                         performed, and patient medications and allergies were                         reviewed. The patient's tolerance of previous                         anesthesia was also reviewed. The risks and benefits                         of the procedure and the sedation options and risks                         were discussed with the patient. All questions were                         answered, and informed consent was obtained. Prior                         Anticoagulants: The patient has taken no previous                         anticoagulant or antiplatelet agents. ASA Grade                         Assessment: II - A patient with mild systemic disease.                         After reviewing the risks and benefits, the patient                         was deemed in satisfactory condition to undergo the                         procedure.                        After obtaining informed consent, the colonoscope was  passed under direct vision. Throughout the procedure,                         the patient's blood pressure, pulse, and oxygen                         saturations were monitored continuously. The was                         introduced through the anus and advanced  to the the                         cecum, identified by appendiceal orifice and ileocecal                         valve. The colonoscopy was performed without                         difficulty. The patient tolerated the procedure well.                         The quality of the bowel preparation was excellent. Findings:      The perianal and digital rectal examinations were normal.      A 3 mm polyp was found in the cecum. The polyp was sessile. The polyp       was removed with a cold snare. Resection and retrieval were complete.      Non-bleeding internal hemorrhoids were found during retroflexion. The       hemorrhoids were Grade I (internal hemorrhoids that do not prolapse). Impression:            - One 3 mm polyp in the cecum, removed with a cold                         snare. Resected and retrieved.                        - Non-bleeding internal hemorrhoids. Recommendation:        - Discharge patient to home.                        - Resume previous diet.                        - Continue present medications.                        - Await pathology results.                        - Repeat colonoscopy in 7 years for surveillance if                         adenomatous and 10 years if hyperplastic. Procedure Code(s):     --- Professional ---                        [phone], Colonoscopy, flexible; with removal of  tumor(s), polyp(s), or other lesion(s) by snare                         technique Diagnosis Code(s):     --- Professional ---                        Z12.11, Encounter for screening for malignant neoplasm                         of colon                        K63.5, Polyp of colon CPT copyright 2019 American Medical Association. All rights reserved. The codes documented in this report are preliminary and upon coder review may  be revised to meet current compliance requirements. Lucilla Lame MD, MD 01/03/2021 9:56:06 AM This report has been signed  electronically. Number of Addenda: 0 Note Initiated On: 01/03/2021 9:32 AM Scope Withdrawal Time: 0 hours 9 minutes 33 seconds  Total Procedure Duration: 0 hours 14 minutes 8 seconds  Estimated Blood Loss:  Estimated blood loss: none.      [facility]

## 2021-01-03 NOTE — Anesthesia Procedure Notes (Signed)
Procedure Name: MAC Date/Time: 01/03/2021 9:35 AM Performed by: Vanetta Shawl, CRNA Pre-anesthesia Checklist: Patient identified, Emergency Drugs available, Suction available, Timeout performed and Patient being monitored Patient Re-evaluated:Patient Re-evaluated prior to induction Oxygen Delivery Method: Nasal cannula Placement Confirmation: positive ETCO2

## 2021-01-03 NOTE — Transfer of Care (Signed)
Immediate Anesthesia Transfer of Care Note  Patient: Matthew Velasquez  Procedure(s) Performed: COLONOSCOPY WITH BIOPSY (N/A Rectum) POLYPECTOMY (N/A Rectum)  Patient Location: PACU  Anesthesia Type: General  Level of Consciousness: awake, alert  and patient cooperative  Airway and Oxygen Therapy: Patient Spontanous Breathing   Post-op Assessment: Post-op Vital signs reviewed, Patient's Cardiovascular Status Stable, Respiratory Function Stable, Patent Airway and No signs of Nausea or vomiting  Post-op Vital Signs: Reviewed and stable  Complications: No complications documented.

## 2021-01-03 NOTE — H&P (Signed)
Lucilla Lame, MD [facility] [address], [place], [place] [address] Phone: [phone] Fax : [phone]  Primary Care Physician:  Glean Hess, MD Primary Gastroenterologist:  Dr. Allen Norris  Pre-Procedure History & Physical: HPI:  Matthew Velasquez is a 55 y.o. male is here for a screening colonoscopy.   Past Medical History:  Diagnosis Date  . Hypertension   . Psoriasis     Past Surgical History:  Procedure Laterality Date  . APPENDECTOMY    . CHOLECYSTECTOMY    . HERNIA REPAIR      Prior to Admission medications   Medication Sig Start Date End Date Taking? Authorizing Provider  cetirizine (ZYRTEC) 10 MG tablet Take by mouth daily.   Yes [provider]  olmesartan (BENICAR) 20 MG tablet Take 1 tablet (20 mg total) by mouth daily. 11/26/20  Yes Glean Hess, MD  VITAMIN D PO Take by mouth daily.   Yes [provider]  clobetasol ointment (TEMOVATE) 5.78 % Apply 1 application topically 2 (two) times daily. 11/22/20   [provider]  pantoprazole (PROTONIX) 20 MG tablet Take 1 tablet (20 mg total) by mouth daily. 08/28/16 06/03/20  Daymon Larsen, MD    Allergies as of 12/10/2020  . (No Known Allergies)    Family History  Problem Relation Age of Onset  . Other Mother        Covid  . Mental illness Mother   . Alzheimer's disease Father   . Thyroid disease Father     Social History   Socioeconomic History  . Marital status: Married    Spouse name: Not on file  . Number of children: Not on file  . Years of education: Not on file  . Highest education level: Not on file  Occupational History  . Not on file  Tobacco Use  . Smoking status: Never Smoker  . Smokeless tobacco: Current User    Types: Snuff  Vaping Use  . Vaping Use: Never used  Substance and Sexual Activity  . Alcohol use: Yes    Alcohol/week: 49.0 standard drinks    Types: 28 Glasses of wine, 21 Shots of liquor per week    Comment: 1 bottle wine/1/4 of a  fifth of liquor daily  . Drug use: Never  . Sexual activity: Yes    Partners: Female  Other Topics Concern  . Not on file  Social History Narrative  . Not on file   Social Determinants of Health   Financial Resource Strain: Not on file  Food Insecurity: Not on file  Transportation Needs: Not on file  Physical Activity: Not on file  Stress: Not on file  Social Connections: Not on file  Intimate Partner Violence: Not on file    Review of Systems: See HPI, otherwise negative ROS  Physical Exam: BP 128/87   Pulse 71   Temp (!) 97.2 F (36.2 C) (Temporal)   Ht 5\' 11"  (1.803 m)   Wt 86.6 kg   SpO2 98%   BMI 26.64 kg/m  General:   Alert,  pleasant and cooperative in NAD Head:  Normocephalic and atraumatic. Neck:  Supple; no masses or thyromegaly. Lungs:  Clear throughout to auscultation.    Heart:  Regular rate and rhythm. Abdomen:  Soft, nontender and nondistended. Normal bowel sounds, without guarding, and without rebound.   Neurologic:  Alert and  oriented x4;  grossly normal neurologically.  Impression/Plan: Matthew Velasquez is now here to undergo a screening colonoscopy.  Risks, benefits, and alternatives regarding colonoscopy have been reviewed with the patient.  Questions have been answered.  All parties agreeable.

## 2021-01-03 NOTE — Anesthesia Postprocedure Evaluation (Signed)
Anesthesia Post Note  Patient: Matthew Velasquez  Procedure(s) Performed: COLONOSCOPY WITH BIOPSY (N/A Rectum) POLYPECTOMY (N/A Rectum)     Patient location during evaluation: PACU Anesthesia Type: General Level of consciousness: awake Pain management: pain level controlled Vital Signs Assessment: post-procedure vital signs reviewed and stable Respiratory status: respiratory function stable Cardiovascular status: stable Postop Assessment: no signs of nausea or vomiting Anesthetic complications: no   No complications documented.  Veda Canning

## 2021-01-03 NOTE — Anesthesia Preprocedure Evaluation (Signed)
Anesthesia Evaluation  Patient identified by MRN, date of birth, ID band Patient awake    Reviewed: Allergy & Precautions, NPO status   Airway Mallampati: II  TM Distance: >3 FB     Dental   Pulmonary neg pulmonary ROS, neg recent URI,    Pulmonary exam normal        Cardiovascular hypertension,  Rhythm:Regular Rate:Normal     Neuro/Psych    GI/Hepatic   Endo/Other    Renal/GU      Musculoskeletal   Abdominal   Peds  Hematology   Anesthesia Other Findings   Reproductive/Obstetrics                             Anesthesia Physical Anesthesia Plan  ASA: II  Anesthesia Plan: General   Post-op Pain Management:    Induction: Intravenous  PONV Risk Score and Plan: Propofol infusion, TIVA and Treatment may vary due to age or medical condition  Airway Management Planned: Natural Airway and Nasal Cannula  Additional Equipment:   Intra-op Plan:   Post-operative Plan:   Informed Consent: I have reviewed the patients History and Physical, chart, labs and discussed the procedure including the risks, benefits and alternatives for the proposed anesthesia with the patient or authorized representative who has indicated his/her understanding and acceptance.       Plan Discussed with: CRNA  Anesthesia Plan Comments:         Anesthesia Quick Evaluation

## 2021-01-04 ENCOUNTER — Encounter: Payer: Self-pay | Admitting: Gastroenterology

## 2021-01-05 ENCOUNTER — Encounter: Payer: Self-pay | Admitting: Gastroenterology

## 2021-01-05 LAB — SURGICAL PATHOLOGY

## 2021-03-18 ENCOUNTER — Encounter: Payer: Self-pay | Admitting: Internal Medicine

## 2021-03-18 ENCOUNTER — Ambulatory Visit: Payer: BC Managed Care – PPO | Admitting: Internal Medicine

## 2021-03-18 ENCOUNTER — Other Ambulatory Visit: Payer: Self-pay

## 2021-03-18 VITALS — BP 108/70 | HR 68 | Ht 71.0 in | Wt 189.0 lb

## 2021-03-18 DIAGNOSIS — I10 Essential (primary) hypertension: Secondary | ICD-10-CM

## 2021-03-18 NOTE — Progress Notes (Signed)
Date:  03/18/2021   Name:  Matthew Velasquez   DOB:  [birth year]   MRN:  [id number]   Chief Complaint: Hypertension  Hypertension This is a new problem. The current episode started more than 1 month ago. The problem is controlled (at home 118/70). Pertinent negatives include no chest pain, headaches, palpitations or shortness of breath. There are no associated agents to hypertension. Past treatments include nothing. The current treatment provides significant (benicar 20 mg) improvement. There are no compliance problems.    Lab Results  Component Value Date   CREATININE 1.06 11/26/2020   BUN 11 11/26/2020   NA 139 11/26/2020   K 4.4 11/26/2020   CL 99 11/26/2020   CO2 23 11/26/2020   Lab Results  Component Value Date   CHOL 218 (H) 11/26/2020   HDL 58 11/26/2020   LDLCALC 137 (H) 11/26/2020   TRIG 132 11/26/2020   CHOLHDL 3.8 11/26/2020   No results found for: TSH No results found for: HGBA1C Lab Results  Component Value Date   WBC 7.1 11/26/2020   HGB 16.6 11/26/2020   HCT 49.7 11/26/2020   MCV 84 11/26/2020   PLT 197 11/26/2020   Lab Results  Component Value Date   ALT 27 11/26/2020   AST 25 11/26/2020   ALKPHOS 90 11/26/2020   BILITOT 1.0 11/26/2020     Review of Systems  Constitutional:  Negative for fatigue and unexpected weight change.  HENT:  Negative for nosebleeds.   Eyes:  Negative for visual disturbance.  Respiratory:  Negative for cough, chest tightness, shortness of breath and wheezing.   Cardiovascular:  Negative for chest pain, palpitations and leg swelling.  Gastrointestinal:  Negative for abdominal pain, constipation and diarrhea.  Neurological:  Positive for light-headedness (brief episodes when standing up from sitting). Negative for dizziness, syncope, weakness and headaches.   Patient Active Problem List   Diagnosis Date Noted   Encounter for screening colonoscopy    Polyp of colon    Mild hyperlipidemia 11/28/2020   Essential  hypertension 11/26/2020   Vitamin D deficiency 11/25/2020   Rash and nonspecific skin eruption 08/16/2020   Myalgia 08/16/2020    No Known Allergies  Past Surgical History:  Procedure Laterality Date   APPENDECTOMY     CHOLECYSTECTOMY     COLONOSCOPY WITH PROPOFOL N/A 01/03/2021   Procedure: COLONOSCOPY WITH BIOPSY;  Surgeon: Lucilla Lame, MD;  Location: [facility];  Service: Endoscopy;  Laterality: N/A;  priority 4   HERNIA REPAIR     POLYPECTOMY N/A 01/03/2021   Procedure: POLYPECTOMY;  Surgeon: Lucilla Lame, MD;  Location: [facility];  Service: Endoscopy;  Laterality: N/A;    Social History   Tobacco Use   Smoking status: Never   Smokeless tobacco: Current    Types: Snuff  Vaping Use   Vaping Use: Never used  Substance Use Topics   Alcohol use: Yes    Alcohol/week: 49.0 standard drinks    Types: 28 Glasses of wine, 21 Shots of liquor per week    Comment: 1 bottle wine/1/4 of a fifth of liquor daily   Drug use: Never     Medication list has been reviewed and updated.  Current Meds  Medication Sig   cetirizine (ZYRTEC) 10 MG tablet Take by mouth daily.   clobetasol ointment (TEMOVATE) 9.76 % Apply 1 application topically 2 (two) times daily.   olmesartan (BENICAR) 20 MG tablet Take 1 tablet (20 mg total) by mouth daily. (Patient taking  differently: Take 10 mg by mouth daily.)   VITAMIN D PO Take by mouth daily.    PHQ 2/9 Scores 03/18/2021 11/26/2020 08/16/2020  PHQ - 2 Score 0 0 0  PHQ- 9 Score 0 0 0    GAD 7 : Generalized Anxiety Score 03/18/2021 11/26/2020 08/16/2020  Nervous, Anxious, on Edge 0 0 0  Control/stop worrying 0 0 0  Worry too much - different things 0 0 0  Trouble relaxing 0 0 0  Restless 0 0 0  Easily annoyed or irritable 0 0 0  Afraid - awful might happen 0 0 0  Total GAD 7 Score 0 0 0  Anxiety Difficulty Not difficult at all - -    BP Readings from Last 3 Encounters:  03/18/21 108/70  01/03/21 106/73  11/26/20 (!) 138/98     Physical Exam Vitals and nursing note reviewed.  Constitutional:      General: He is not in acute distress.    Appearance: He is well-developed.  HENT:     Head: Normocephalic and atraumatic.  Cardiovascular:     Rate and Rhythm: Normal rate and regular rhythm.     Pulses: Normal pulses.     Heart sounds: No murmur heard. Pulmonary:     Effort: Pulmonary effort is normal. No respiratory distress.     Breath sounds: No wheezing or rhonchi.  Musculoskeletal:     Cervical back: Normal range of motion.     Right lower leg: No edema.     Left lower leg: No edema.  Lymphadenopathy:     Cervical: No cervical adenopathy.  Skin:    General: Skin is warm and dry.  Neurological:     General: No focal deficit present.     Mental Status: He is alert and oriented to person, place, and time.  Psychiatric:        Mood and Affect: Mood normal.        Behavior: Behavior normal.    Wt Readings from Last 3 Encounters:  03/18/21 189 lb (85.7 kg)  01/03/21 191 lb (86.6 kg)  11/26/20 194 lb (88 kg)    BP 108/70 (BP Location: Right Arm, Patient Position: Sitting, Cuff Size: Normal)   Pulse 68   Ht 5\' 11"  (1.803 m)   Wt 189 lb (85.7 kg)   SpO2 99%   BMI 26.36 kg/m   Assessment and Plan: 1. Essential hypertension Clinically stable exam with well controlled BP. Tolerating medications with mild side effects at this time.  Will reduce dose to 10 mg. He will take 1/2 tablet daily and monitor BP. Pt to continue low sodium diet; regular exercise and limit alcohol intake.   Partially dictated using Editor, commissioning. Any errors are unintentional.  Halina Maidens, MD [facility]  03/18/2021

## 2021-03-18 NOTE — Patient Instructions (Signed)
Take only 1/2 olmesartan = 10 mg daily  Monitor BP several times per week with goal BP 120-130/70-80

## 2021-05-16 ENCOUNTER — Other Ambulatory Visit: Payer: Self-pay | Admitting: Internal Medicine

## 2021-05-16 DIAGNOSIS — I10 Essential (primary) hypertension: Secondary | ICD-10-CM

## 2021-05-17 NOTE — Telephone Encounter (Signed)
Requested medications are due for refill today.  yes  Requested medications are on the active medications list.  yes  Last refill. 11/26/2020  Future visit scheduled.   yes  Notes to clinic.  Rx is written as 1 tablet daily. Per note of 03/18/2021 pt is to take 1/2 tablet daily,('10mg'$ ). Please advise.

## 2021-08-05 ENCOUNTER — Ambulatory Visit: Payer: BC Managed Care – PPO | Admitting: Internal Medicine

## 2021-11-30 ENCOUNTER — Ambulatory Visit (INDEPENDENT_AMBULATORY_CARE_PROVIDER_SITE_OTHER): Payer: BC Managed Care – PPO | Admitting: Internal Medicine

## 2021-11-30 ENCOUNTER — Other Ambulatory Visit: Payer: Self-pay

## 2021-11-30 ENCOUNTER — Encounter: Payer: Self-pay | Admitting: Internal Medicine

## 2021-11-30 VITALS — BP 138/100 | HR 98 | Ht 71.0 in | Wt 189.0 lb

## 2021-11-30 DIAGNOSIS — Z125 Encounter for screening for malignant neoplasm of prostate: Secondary | ICD-10-CM | POA: Diagnosis not present

## 2021-11-30 DIAGNOSIS — Z0000 Encounter for general adult medical examination without abnormal findings: Secondary | ICD-10-CM

## 2021-11-30 DIAGNOSIS — L309 Dermatitis, unspecified: Secondary | ICD-10-CM | POA: Diagnosis not present

## 2021-11-30 DIAGNOSIS — E785 Hyperlipidemia, unspecified: Secondary | ICD-10-CM

## 2021-11-30 DIAGNOSIS — Z23 Encounter for immunization: Secondary | ICD-10-CM

## 2021-11-30 DIAGNOSIS — I10 Essential (primary) hypertension: Secondary | ICD-10-CM | POA: Diagnosis not present

## 2021-11-30 MED ORDER — CLOBETASOL PROPIONATE 0.05 % EX OINT: 1.0000 "application " | TOPICAL_OINTMENT | Freq: Two times a day (BID) | CUTANEOUS | 1 refills | Status: AC

## 2021-11-30 MED ORDER — AMLODIPINE BESYLATE 5 MG PO TABS: 5.0000 mg | ORAL_TABLET | Freq: Every day | ORAL | 0 refills | Status: DC

## 2021-11-30 NOTE — Progress Notes (Signed)
? ? ?Date:  11/30/2021  ? ?Name:  Matthew Velasquez   DOB:  05/16/1966   MRN:  [id number] ? ? ?Chief Complaint: Annual Exam ?Matthew Velasquez is a 56 y.o. male who presents today for his Complete Annual Exam. He feels fairly well. He reports exercising walking every other day. He reports he is sleeping poorly.  ? ?Colonoscopy: 01/2021 ? ?Immunization History  ?Administered Date(s) Administered  ? Influenza,inj,Quad PF,6+ Mos 09/14/2018  ? Influenza-Unspecified 06/21/2020  ? PFIZER(Purple Top)SARS-COV-2 Vaccination 11/26/2019, 12/17/2019, 06/21/2020  ? ?Health Maintenance Due  ?Topic Date Due  ? TETANUS/TDAP  Never done  ?  ?Lab Results  ?Component Value Date  ? PSA1 1.5 11/26/2020  ? ? ?Hypertension ?This is a chronic problem. The problem is uncontrolled. Pertinent negatives include no chest pain, headaches, palpitations or shortness of breath. Past treatments include angiotensin blockers (stopped amlodipine). The current treatment provides significant improvement. There is no history of kidney disease, CAD/MI or CVA.  ? ?Lab Results  ?Component Value Date  ? NA 139 11/26/2020  ? K 4.4 11/26/2020  ? CO2 23 11/26/2020  ? GLUCOSE 89 11/26/2020  ? BUN 11 11/26/2020  ? CREATININE 1.06 11/26/2020  ? CALCIUM 9.6 11/26/2020  ? EGFR 83 11/26/2020  ? GFRNONAA >60 07/30/2020  ? ?Lab Results  ?Component Value Date  ? CHOL 218 (H) 11/26/2020  ? HDL 58 11/26/2020  ? LDLCALC 137 (H) 11/26/2020  ? TRIG 132 11/26/2020  ? CHOLHDL 3.8 11/26/2020  ? ?No results found for: TSH ?No results found for: HGBA1C ?Lab Results  ?Component Value Date  ? WBC 7.1 11/26/2020  ? HGB 16.6 11/26/2020  ? HCT 49.7 11/26/2020  ? MCV 84 11/26/2020  ? PLT 197 11/26/2020  ? ?Lab Results  ?Component Value Date  ? ALT 27 11/26/2020  ? AST 25 11/26/2020  ? ALKPHOS 90 11/26/2020  ? BILITOT 1.0 11/26/2020  ? ?Lab Results  ?Component Value Date  ? VD25OH 39.4 11/26/2020  ?  ? ?Review of Systems  ?Constitutional:  Negative for appetite change, chills, diaphoresis,  fatigue and unexpected weight change.  ?HENT:  Negative for hearing loss, tinnitus, trouble swallowing and voice change.   ?Eyes:  Negative for visual disturbance.  ?Respiratory:  Negative for choking, shortness of breath and wheezing.   ?Cardiovascular:  Negative for chest pain, palpitations and leg swelling.  ?Gastrointestinal:  Negative for abdominal pain, blood in stool, constipation and diarrhea.  ?Genitourinary:  Negative for difficulty urinating, dysuria and frequency.  ?Musculoskeletal:  Negative for arthralgias, back pain and myalgias.  ?Skin:  Positive for rash. Negative for color change.  ?Neurological:  Negative for dizziness, syncope and headaches.  ?Hematological:  Negative for adenopathy.  ?Psychiatric/Behavioral:  Negative for dysphoric mood and sleep disturbance. The patient is not nervous/anxious.   ? ?Patient Active Problem List  ? Diagnosis Date Noted  ? Encounter for screening colonoscopy   ? Polyp of colon   ? Mild hyperlipidemia 11/28/2020  ? Essential hypertension 11/26/2020  ? Vitamin D deficiency 11/25/2020  ? Rash and nonspecific skin eruption 08/16/2020  ? Myalgia 08/16/2020  ? ? ?No Known Allergies ? ?Past Surgical History:  ?Procedure Laterality Date  ? APPENDECTOMY    ? CHOLECYSTECTOMY    ? COLONOSCOPY WITH PROPOFOL N/A 01/03/2021  ? Procedure: COLONOSCOPY WITH BIOPSY;  Surgeon: Lucilla Lame, MD;  Location: [facility];  Service: Endoscopy;  Laterality: N/A;  priority 4  ? HERNIA REPAIR    ? POLYPECTOMY N/A 01/03/2021  ?  Procedure: POLYPECTOMY;  Surgeon: Lucilla Lame, MD;  Location: [facility];  Service: Endoscopy;  Laterality: N/A;  ? ? ?Social History  ? ?Tobacco Use  ? Smoking status: Never  ? Smokeless tobacco: Current  ?  Types: Snuff  ?Vaping Use  ? Vaping Use: Never used  ?Substance Use Topics  ? Alcohol use: Yes  ?  Alcohol/week: 49.0 standard drinks  ?  Types: 28 Glasses of wine, 21 Shots of liquor per week  ?  Comment: 1 bottle wine/1/4 of a fifth of liquor daily   ? Drug use: Never  ? ? ? ?Medication list has been reviewed and updated. ? ?Current Meds  ?Medication Sig  ? clobetasol ointment (TEMOVATE) 1.24 % Apply 1 application topically 2 (two) times daily.  ? VITAMIN D PO Take by mouth daily.  ? ? ? ?  11/30/2021  ?  8:03 AM 03/18/2021  ?  8:21 AM 11/26/2020  ?  8:02 AM 08/16/2020  ?  2:19 PM  ?GAD 7 : Generalized Anxiety Score  ?Nervous, Anxious, on Edge 0 0 0 0  ?Control/stop worrying 0 0 0 0  ?Worry too much - different things 0 0 0 0  ?Trouble relaxing 0 0 0 0  ?Restless 0 0 0 0  ?Easily annoyed or irritable 0 0 0 0  ?Afraid - awful might happen 0 0 0 0  ?Total GAD 7 Score 0 0 0 0  ?Anxiety Difficulty Not difficult at all Not difficult at all    ? ? ? ?  11/30/2021  ?  8:03 AM  ?Depression screen PHQ 2/9  ?Decreased Interest 0  ?Down, Depressed, Hopeless 0  ?PHQ - 2 Score 0  ?Altered sleeping 0  ?Tired, decreased energy 0  ?Change in appetite 0  ?Feeling bad or failure about yourself  0  ?Trouble concentrating 0  ?Moving slowly or fidgety/restless 0  ?Suicidal thoughts 0  ?PHQ-9 Score 0  ?Difficult doing work/chores Not difficult at all  ? ? ?BP Readings from Last 3 Encounters:  ?11/30/21 (!) 138/100  ?03/18/21 108/70  ?01/03/21 106/73  ? ? ?Physical Exam ?Vitals and nursing note reviewed.  ?Constitutional:   ?   Appearance: Normal appearance. He is well-developed.  ?HENT:  ?   Head: Normocephalic.  ?   Right Ear: Tympanic membrane, ear canal and external ear normal.  ?   Left Ear: Tympanic membrane, ear canal and external ear normal.  ?   Nose: Nose normal.  ?Eyes:  ?   Conjunctiva/sclera: Conjunctivae normal.  ?   Pupils: Pupils are equal, round, and reactive to light.  ?Neck:  ?   Thyroid: No thyromegaly.  ?   Vascular: No carotid bruit.  ?Cardiovascular:  ?   Rate and Rhythm: Normal rate and regular rhythm.  ?   Heart sounds: Normal heart sounds.  ?Pulmonary:  ?   Effort: Pulmonary effort is normal.  ?   Breath sounds: Normal breath sounds. No wheezing.  ?Chest:   ?Breasts: ?   Right: No mass.  ?   Left: No mass.  ?Abdominal:  ?   General: Bowel sounds are normal.  ?   Palpations: Abdomen is soft.  ?   Tenderness: There is no abdominal tenderness.  ?Musculoskeletal:     ?   General: Normal range of motion.  ?   Cervical back: Normal range of motion and neck supple.  ?   Right lower leg: No edema.  ?   Left lower leg: No  edema.  ?Lymphadenopathy:  ?   Cervical: No cervical adenopathy.  ?Skin: ?   General: Skin is warm and dry.  ?   Capillary Refill: Capillary refill takes less than 2 seconds.  ?   Findings: Erythema present.  ?   Comments: Anterior shin with excoriation from scratching  ?Neurological:  ?   General: No focal deficit present.  ?   Mental Status: He is alert and oriented to person, place, and time.  ?   Deep Tendon Reflexes: Reflexes are normal and symmetric.  ?Psychiatric:     ?   Attention and Perception: Attention normal.     ?   Mood and Affect: Mood normal.     ?   Thought Content: Thought content normal.  ? ? ?Wt Readings from Last 3 Encounters:  ?11/30/21 189 lb (85.7 kg)  ?03/18/21 189 lb (85.7 kg)  ?01/03/21 191 lb (86.6 kg)  ? ? ?BP (!) 138/100   Pulse 98   Ht 5' 11"  (1.803 m)   Wt 189 lb (85.7 kg)   SpO2 98%   BMI 26.36 kg/m?  ? ?Assessment and Plan: ?1. Annual physical exam ?Normal exam. ?Recommend more regular exercise and limit alcohol intake to 2 drinks per day. ? ?2. Prostate cancer screening ?DRE deferred ?- PSA ? ?3. Essential hypertension ?BP not controlled due to stopping medications. ?DASH diet discussed. ?Begin amlodipine 5 mg and follow up in 2 months ?- CBC with Differential/Platelet ?- Comprehensive metabolic panel ?- POCT urinalysis dipstick ?- amLODipine (NORVASC) 5 MG tablet; Take 1 tablet (5 mg total) by mouth daily.  Dispense: 90 tablet; Refill: 0 ? ?4. Mild hyperlipidemia ?Check labs and advise ?- Lipid panel ? ?5. Eczema, unspecified type ?- clobetasol ointment (TEMOVATE) 0.05 %; Apply 1 application. topically 2 (two) times  daily.  Dispense: 30 g; Refill: 1 ? ?6. Need for diphtheria-tetanus-pertussis (Tdap) vaccine ?- Tdap vaccine greater than or equal to 7yo IM ? ? ?Partially dictated using Editor, commissioning. Any errors are unintentional.

## 2021-12-01 ENCOUNTER — Encounter: Payer: Self-pay | Admitting: Internal Medicine

## 2021-12-01 DIAGNOSIS — R972 Elevated prostate specific antigen [PSA]: Secondary | ICD-10-CM | POA: Insufficient documentation

## 2021-12-01 LAB — LIPID PANEL
Chol/HDL Ratio: 4.1 ratio (ref 0.0–5.0)
Cholesterol, Total: 243 mg/dL — ABNORMAL HIGH (ref 100–199)
HDL: 59 mg/dL (ref >39)
LDL Chol Calc (NIH): 143 mg/dL — ABNORMAL HIGH (ref 0–99)
Triglycerides: 228 mg/dL — ABNORMAL HIGH (ref 0–149)
VLDL Cholesterol Cal: 41 mg/dL — ABNORMAL HIGH (ref 5–40)

## 2021-12-01 LAB — COMPREHENSIVE METABOLIC PANEL
ALT: 39 IU/L (ref 0–44)
AST: 27 IU/L (ref 0–40)
Albumin/Globulin Ratio: 1.7 (ref 1.2–2.2)
Albumin: 4.7 g/dL (ref 3.8–4.9)
Alkaline Phosphatase: 90 IU/L (ref 44–121)
BUN/Creatinine Ratio: 11 (ref 9–20)
BUN: 12 mg/dL (ref 6–24)
Bilirubin Total: 0.6 mg/dL (ref 0.0–1.2)
CO2: 25 mmol/L (ref 20–29)
Calcium: 9.6 mg/dL (ref 8.7–10.2)
Chloride: 102 mmol/L (ref 96–106)
Creatinine, Ser: 1.05 mg/dL (ref 0.76–1.27)
Globulin, Total: 2.8 g/dL (ref 1.5–4.5)
Glucose: 97 mg/dL (ref 70–99)
Potassium: 4.4 mmol/L (ref 3.5–5.2)
Sodium: 145 mmol/L — ABNORMAL HIGH (ref 134–144)
Total Protein: 7.5 g/dL (ref 6.0–8.5)
eGFR: 84 mL/min/{1.73_m2} (ref >59)

## 2021-12-01 LAB — CBC WITH DIFFERENTIAL/PLATELET
Basophils Absolute: 0.1 10*3/uL (ref 0.0–0.2)
Basos: 1 %
EOS (ABSOLUTE): 0.1 10*3/uL (ref 0.0–0.4)
Eos: 1 %
Hematocrit: 49.9 % (ref 37.5–51.0)
Hemoglobin: 16.9 g/dL (ref 13.0–17.7)
Immature Grans (Abs): 0 10*3/uL (ref 0.0–0.1)
Immature Granulocytes: 0 %
Lymphocytes Absolute: 1.9 10*3/uL (ref 0.7–3.1)
Lymphs: 27 %
MCH: 28.5 pg (ref 26.6–33.0)
MCHC: 33.9 g/dL (ref 31.5–35.7)
MCV: 84 fL (ref 79–97)
Monocytes Absolute: 0.6 10*3/uL (ref 0.1–0.9)
Monocytes: 8 %
Neutrophils Absolute: 4.3 10*3/uL (ref 1.4–7.0)
Neutrophils: 63 %
Platelets: 203 10*3/uL (ref 150–450)
RBC: 5.94 x10E6/uL — ABNORMAL HIGH (ref 4.14–5.80)
RDW: 12.7 % (ref 11.6–15.4)
WBC: 6.9 10*3/uL (ref 3.4–10.8)

## 2021-12-01 LAB — PSA: Prostate Specific Ag, Serum: 3.3 ng/mL (ref 0.0–4.0)

## 2022-03-21 ENCOUNTER — Ambulatory Visit
Admission: EM | Admit: 2022-03-21 | Discharge: 2022-03-21 | Disposition: A | Discharge status: Home / Self Care | Payer: BC Managed Care – PPO | Attending: Internal Medicine | Admitting: Internal Medicine

## 2022-03-21 ENCOUNTER — Encounter: Payer: Self-pay | Admitting: Emergency Medicine

## 2022-03-21 ENCOUNTER — Other Ambulatory Visit: Payer: Self-pay | Admitting: Internal Medicine

## 2022-03-21 DIAGNOSIS — R0981 Nasal congestion: Secondary | ICD-10-CM

## 2022-03-21 DIAGNOSIS — I10 Essential (primary) hypertension: Secondary | ICD-10-CM

## 2022-03-21 DIAGNOSIS — K1120 Sialoadenitis, unspecified: Secondary | ICD-10-CM

## 2022-03-21 MED ORDER — AMOXICILLIN-POT CLAVULANATE 875-125 MG PO TABS: 1.0000 | ORAL_TABLET | Freq: Two times a day (BID) | ORAL | 0 refills | Status: AC

## 2022-03-21 MED ORDER — TRIAMCINOLONE ACETONIDE 55 MCG/ACT NA AERO: 2.0000 | INHALATION_SPRAY | Freq: Every day | NASAL | 0 refills | Status: DC

## 2022-03-21 NOTE — Discharge Instructions (Addendum)
Symptoms and exam today are most consistent with a mild parotitis, inflammation of a large salivary gland in the cheek that is most commonly caused by a virus or a salivary gland stone.  Push fluids and try sucking hard/sour candies to increase salivary flow and flush the gland.  Anticipate gradual improvement over the next week or two.  Followup with an ENT care provider if symptoms are not improving as expected. Other explanations for L ear/L jaw area pain include sinus or ear congestion or infection, or barotrauma (ear drum injury) from trying to unblock a stopped up ear.  It is unusual but not impossible for there to be a bacterial component to these symptoms, and they have persisted for 10 days so prescription for amoxicillin/clavulanate was sent to the pharmacy.  Prescription for a nasal steroid spray was also sent to the pharmacy, to help with any congestion that may be contributing to symptoms.

## 2022-03-21 NOTE — ED Triage Notes (Signed)
Pt c/o left ear pain x 1 week

## 2022-03-22 NOTE — Telephone Encounter (Signed)
Requested Prescriptions  Pending Prescriptions Disp Refills  . amLODipine (NORVASC) 5 MG tablet [Pharmacy Med Name: AMLODIPINE BESYLATE 5 MG TAB] 90 tablet 0    Sig: TAKE 1 TABLET (5 MG TOTAL) BY MOUTH DAILY.     Cardiovascular: Calcium Channel Blockers 2 Passed - 03/21/2022  1:09 PM      Passed - Last BP in normal range    BP Readings from Last 1 Encounters:  03/21/22 139/80         Passed - Last Heart Rate in normal range    Pulse Readings from Last 1 Encounters:  03/21/22 80         Passed - Valid encounter within last 6 months    Recent Outpatient Visits          3 months ago Annual physical exam   [facility] Glean Hess, MD   1 year ago Essential hypertension   [facility], Laura H, MD   1 year ago Annual physical exam   [facility] Glean Hess, MD   1 year ago Myalgia   [facility] Glean Hess, MD

## 2022-03-23 NOTE — ED Provider Notes (Signed)
[facility]    CSN: [id number] Arrival date & time: 03/21/22  0913      History   Chief Complaint Chief Complaint  Patient presents with   Otalgia    HPI Matthew Velasquez is a 56 y.o. male. He presents with 7-10 day hx of pain below the left ear, in the left jaw, hurts with opening mouth.  Has been absent from dental care for some time but not having pain with chewing.  Not much runny/congested nose, not coughing.  No headache, no fever, no malaise.  Not vomiting, no diarrhea.  Not troubled with dry mouth historically or at present.     Otalgia   Past Medical History:  Diagnosis Date   Hypertension    Psoriasis     Patient Active Problem List   Diagnosis Date Noted   Increased prostate specific antigen (PSA) velocity 12/01/2021   Encounter for screening colonoscopy    Polyp of colon    Mild hyperlipidemia 11/28/2020   Essential hypertension 11/26/2020   Vitamin D deficiency 11/25/2020   Rash and nonspecific skin eruption 08/16/2020   Myalgia 08/16/2020    Past Surgical History:  Procedure Laterality Date   APPENDECTOMY     CHOLECYSTECTOMY     COLONOSCOPY WITH PROPOFOL N/A 01/03/2021   Procedure: COLONOSCOPY WITH BIOPSY;  Surgeon: Lucilla Lame, MD;  Location: [facility];  Service: Endoscopy;  Laterality: N/A;  priority 4   HERNIA REPAIR     POLYPECTOMY N/A 01/03/2021   Procedure: POLYPECTOMY;  Surgeon: Lucilla Lame, MD;  Location: [facility];  Service: Endoscopy;  Laterality: N/A;       Home Medications    Prior to Admission medications   Medication Sig Start Date End Date Taking? Authorizing Provider  amoxicillin-clavulanate (AUGMENTIN) 875-125 MG tablet Take 1 tablet by mouth 2 (two) times daily for 10 days. 03/21/22 03/31/22 Yes Wynona Luna, MD  triamcinolone (NASACORT) 55 MCG/ACT AERO nasal inhaler Place 2 sprays into the nose daily. 03/21/22  Yes Wynona Luna, MD  amLODipine (NORVASC) 5 MG tablet TAKE 1 TABLET (5  MG TOTAL) BY MOUTH DAILY. 03/22/22   Glean Hess, MD  cetirizine (ZYRTEC) 10 MG tablet Take by mouth daily. Patient not taking: Reported on 11/30/2021    [provider]  clobetasol ointment (TEMOVATE) 3.50 % Apply 1 application. topically 2 (two) times daily. 11/30/21   Glean Hess, MD  VITAMIN D PO Take by mouth daily.    [provider]  pantoprazole (PROTONIX) 20 MG tablet Take 1 tablet (20 mg total) by mouth daily. 08/28/16 06/03/20  Daymon Larsen, MD    Family History Family History  Problem Relation Age of Onset   Other Mother        Covid   Mental illness Mother    Alzheimer's disease Father    Thyroid disease Father     Social History Social History   Tobacco Use   Smoking status: Never   Smokeless tobacco: Current    Types: Snuff  Vaping Use   Vaping Use: Never used  Substance Use Topics   Alcohol use: Yes    Alcohol/week: 49.0 standard drinks of alcohol    Types: 28 Glasses of wine, 21 Shots of liquor per week    Comment: 1 bottle wine/1/4 of a fifth of liquor daily   Drug use: Never     Allergies   Patient has no known allergies.   Review of Systems Review of Systems  HENT:  Positive for ear pain.     see HPI   Physical Exam Triage Vital Signs ED Triage Vitals  Enc Vitals Group     BP 03/21/22 0937 139/80     Pulse Rate 03/21/22 0937 80     Resp 03/21/22 0937 16     Temp 03/21/22 0937 98.1 F (36.7 C)     Temp Source 03/21/22 0937 Oral     SpO2 03/21/22 0937 96 %     Weight --      Height --      Pain Score 03/21/22 0936 4     Pain Loc --    Updated Vital Signs BP 139/80 (BP Location: Left Arm)   Pulse 80   Temp 98.1 F (36.7 C) (Oral)   Resp 16   SpO2 96%   Physical Exam Constitutional:      General: He is not in acute distress.    Appearance: He is not ill-appearing.     Comments: Good hygiene  HENT:     Head: Atraumatic.     Jaw: No trismus.      Comments: B TMs dull, red tinged.  Pt has been  forcefully blowing nose against resistance, trying to see if 'unblocking' his ears helps with L jaw/ear pain Mod nasal congestion bilaterally Some swelling intraoral at the left parotid papilla, nothing expressible and not tender; teeth appear to be in fair repair Tenderness to palpation as in diagram and left angle of jaw and inferiorly.  No erythema    Mouth/Throat:     Mouth: Mucous membranes are moist.  Eyes:     Conjunctiva/sclera:     Right eye: Right conjunctiva is not injected. No exudate.    Left eye: Left conjunctiva is not injected. No exudate.    Comments: Conjugate gaze observed  Cardiovascular:     Rate and Rhythm: Normal rate.  Pulmonary:     Effort: Pulmonary effort is normal. No respiratory distress.  Abdominal:     General: There is no distension.  Musculoskeletal:     Cervical back: Neck supple.     Comments: Walked into the urgent care independently  Skin:    General: Skin is warm and dry.     Comments: No cyanosis  Neurological:     Mental Status: He is alert.     Comments: Face is symmetric, speech is clear/coherent/logical      UC Treatments / Results  Labs (all labs ordered are listed, but only abnormal results are displayed) Labs Reviewed - No data to display NA  EKG NA  Radiology No results found. NA  Procedures Procedures (including critical care time) NA  Medications Ordered in UC Medications - No data to display NA  Final Clinical Impressions(s) / UC Diagnoses   Final diagnoses:  Parotitis not due to mumps  Sinus congestion     Discharge Instructions      Symptoms and exam today are most consistent with a mild parotitis, inflammation of a large salivary gland in the cheek that is most commonly caused by a virus or a salivary gland stone.  Push fluids and try sucking hard/sour candies to increase salivary flow and flush the gland.  Anticipate gradual improvement over the next week or two.  Followup with an ENT care provider if  symptoms are not improving as expected. Other explanations for L ear/L jaw area pain include sinus or ear congestion or infection, or barotrauma (ear drum injury) from trying to unblock a  stopped up ear.  It is unusual but not impossible for there to be a bacterial component to these symptoms, and they have persisted for 10 days so prescription for amoxicillin/clavulanate was sent to the pharmacy.  Prescription for a nasal steroid spray was also sent to the pharmacy, to help with any congestion that may be contributing to symptoms.     ED Prescriptions     Medication Sig Dispense Auth. Provider   triamcinolone (NASACORT) 55 MCG/ACT AERO nasal inhaler Place 2 sprays into the nose daily. 1 each Wynona Luna, MD   amoxicillin-clavulanate (AUGMENTIN) 875-125 MG tablet Take 1 tablet by mouth 2 (two) times daily for 10 days. 20 tablet Wynona Luna, MD      PDMP not reviewed this encounter.   Wynona Luna, MD 03/24/22 1050

## 2022-06-13 ENCOUNTER — Other Ambulatory Visit: Payer: Self-pay | Admitting: Internal Medicine

## 2022-06-13 DIAGNOSIS — I10 Essential (primary) hypertension: Secondary | ICD-10-CM

## 2022-06-13 NOTE — Telephone Encounter (Signed)
Requested Prescriptions  Pending Prescriptions Disp Refills  . amLODipine (NORVASC) 5 MG tablet [Pharmacy Med Name: AMLODIPINE BESYLATE 5 MG TAB] 90 tablet 0    Sig: TAKE 1 TABLET (5 MG TOTAL) BY MOUTH DAILY.     Cardiovascular: Calcium Channel Blockers 2 Failed - 06/13/2022  2:23 AM      Failed - Valid encounter within last 6 months    Recent Outpatient Visits          6 months ago Annual physical exam   [place] [facility] at [facility], Jesse Sans, MD   1 year ago Essential hypertension   [place] [facility] at [facility], Jesse Sans, MD   1 year ago Annual physical exam    [facility] Medicine at [facility], Jesse Sans, MD   1 year ago [place] [facility], Jesse Sans, MD             Passed - Last BP in normal range    BP Readings from Last 1 Encounters:  03/21/22 139/80         Passed - Last Heart Rate in normal range    Pulse Readings from Last 1 Encounters:  03/21/22 80

## 2022-08-10 ENCOUNTER — Ambulatory Visit: Payer: Self-pay | Admitting: *Deleted

## 2022-08-10 DIAGNOSIS — R42 Dizziness and giddiness: Secondary | ICD-10-CM | POA: Diagnosis not present

## 2022-08-10 DIAGNOSIS — R9431 Abnormal electrocardiogram [ECG] [EKG]: Secondary | ICD-10-CM | POA: Diagnosis not present

## 2022-08-10 DIAGNOSIS — I10 Essential (primary) hypertension: Secondary | ICD-10-CM | POA: Diagnosis not present

## 2022-08-10 DIAGNOSIS — R55 Syncope and collapse: Secondary | ICD-10-CM | POA: Diagnosis not present

## 2022-08-10 DIAGNOSIS — R000 Tachycardia, unspecified: Secondary | ICD-10-CM | POA: Diagnosis not present

## 2022-08-10 DIAGNOSIS — R002 Palpitations: Secondary | ICD-10-CM | POA: Diagnosis not present

## 2022-08-10 DIAGNOSIS — I491 Atrial premature depolarization: Secondary | ICD-10-CM | POA: Diagnosis not present

## 2022-08-10 NOTE — Telephone Encounter (Signed)
Noted  KP 

## 2022-08-10 NOTE — Telephone Encounter (Signed)
Reason for Disposition  Extra heartbeats, irregular heart beating, or heart is beating very fast  (i.e., "palpitations")  Answer Assessment - Initial Assessment Questions 1. DESCRIPTION: "Describe your dizziness."     Felt faint 2. LIGHTHEADED: "Do you feel lightheaded?" (e.g., somewhat faint, woozy, weak upon standing)     woozy 3. VERTIGO: "Do you feel like either you or the room is spinning or tilting?" (i.e. vertigo)     no 4. SEVERITY: "How bad is it?"  "Do you feel like you are going to faint?" "Can you stand and walk?"   - MILD: Feels slightly dizzy, but walking normally.   - MODERATE: Feels unsteady when walking, but not falling; interferes with normal activities (e.g., school, work).   - SEVERE: Unable to walk without falling, or requires assistance to walk without falling; feels like passing out now.      mild 5. ONSET:  "When did the dizziness begin?"     1 week ago 6. AGGRAVATING FACTORS: "Does anything make it worse?" (e.g., standing, change in head position)     Changing position- bending 7. HEART RATE: "Can you tell me your heart rate?" "How many beats in 15 seconds?"  (Note: not all patients can do this)       Feels heart fluttering- fast 8. CAUSE: "What do you think is causing the dizziness?"     unsure 9. RECURRENT SYMPTOM: "Have you had dizziness before?" If Yes, ask: "When was the last time?" "What happened that time?"     No- not like this 10. OTHER SYMPTOMS: "Do you have any other symptoms?" (e.g., fever, chest pain, vomiting, diarrhea, bleeding)       no  Protocols used: Dizziness - Lightheadedness-A-AH

## 2022-08-10 NOTE — Telephone Encounter (Signed)
  Chief Complaint: dizziness Symptoms: dizziness- comes and goes, states he feels heart racing during episodes Frequency: 1 week Pertinent Negatives: Patient denies chest pain,fever, vomiting, diarrhea, bleeding Disposition: '[]'$ ED /'[]'$ Urgent Care (no appt availability in office) / '[]'$ Appointment(In office/virtual)/ '[]'$  [facility]/ '[]'$ Home Care/ '[x]'$ Refused Recommended Disposition /'[]'$ [facility]/ '[]'$  Follow-up with PCP Additional Notes: Patient is out of town- declines to go to ED - request appointment in office- will come home. Patient advised any changes- ED

## 2022-08-11 ENCOUNTER — Ambulatory Visit: Payer: BC Managed Care – PPO | Admitting: Internal Medicine

## 2022-08-11 ENCOUNTER — Encounter: Payer: Self-pay | Admitting: Internal Medicine

## 2022-08-11 VITALS — BP 110/76 | HR 103 | Ht 71.0 in | Wt 196.0 lb

## 2022-08-11 DIAGNOSIS — I10 Essential (primary) hypertension: Secondary | ICD-10-CM

## 2022-08-11 DIAGNOSIS — Z23 Encounter for immunization: Secondary | ICD-10-CM | POA: Diagnosis not present

## 2022-08-11 DIAGNOSIS — R000 Tachycardia, unspecified: Secondary | ICD-10-CM

## 2022-08-11 DIAGNOSIS — R55 Syncope and collapse: Secondary | ICD-10-CM | POA: Diagnosis not present

## 2022-08-11 NOTE — Progress Notes (Signed)
Date:  08/11/2022   Name:  Matthew Velasquez   DOB:  1966-08-23   MRN:  [id number]   Chief Complaint: Dizziness  Dizziness This is a new problem. Episode onset: X 2 weeks. The problem has been gradually improving. Associated symptoms include congestion, fatigue and myalgias. Pertinent negatives include no chest pain, chills, coughing, joint swelling, visual change or vomiting. Associated symptoms comments: Palpitations and lightheadedness/presyncope. He has tried drinking for the symptoms. The treatment provided moderate relief.  Seen in ER yesterday.  EKG showed borderline tachycardia.  Labs and troponins were normal. He was referred to the [facility].  Lab Results  Component Value Date   NA 145 (H) 11/30/2021   K 4.4 11/30/2021   CO2 25 11/30/2021   GLUCOSE 97 11/30/2021   BUN 12 11/30/2021   CREATININE 1.05 11/30/2021   CALCIUM 9.6 11/30/2021   EGFR 84 11/30/2021   GFRNONAA >60 07/30/2020   Lab Results  Component Value Date   CHOL 243 (H) 11/30/2021   HDL 59 11/30/2021   LDLCALC 143 (H) 11/30/2021   TRIG 228 (H) 11/30/2021   CHOLHDL 4.1 11/30/2021   No results found for: "TSH" No results found for: "HGBA1C" Lab Results  Component Value Date   WBC 6.9 11/30/2021   HGB 16.9 11/30/2021   HCT 49.9 11/30/2021   MCV 84 11/30/2021   PLT 203 11/30/2021   Lab Results  Component Value Date   ALT 39 11/30/2021   AST 27 11/30/2021   ALKPHOS 90 11/30/2021   BILITOT 0.6 11/30/2021   Lab Results  Component Value Date   VD25OH 39.4 11/26/2020     Review of Systems  Constitutional:  Positive for fatigue. Negative for chills.  HENT:  Positive for congestion.   Respiratory:  Negative for cough, chest tightness, shortness of breath and wheezing.   Cardiovascular:  Positive for palpitations. Negative for chest pain and leg swelling.  Gastrointestinal:  Negative for vomiting.  Musculoskeletal:  Positive for myalgias. Negative for joint swelling.  Neurological:  Positive  for dizziness.  Psychiatric/Behavioral:  Negative for dysphoric mood and sleep disturbance. The patient is not nervous/anxious.     Patient Active Problem List   Diagnosis Date Noted   Increased prostate specific antigen (PSA) velocity 12/01/2021   Encounter for screening colonoscopy    Polyp of colon    Mild hyperlipidemia 11/28/2020   Essential hypertension 11/26/2020   Vitamin D deficiency 11/25/2020   Rash and nonspecific skin eruption 08/16/2020   Myalgia 08/16/2020    No Known Allergies  Past Surgical History:  Procedure Laterality Date   APPENDECTOMY     CHOLECYSTECTOMY     COLONOSCOPY WITH PROPOFOL N/A 01/03/2021   Procedure: COLONOSCOPY WITH BIOPSY;  Surgeon: Lucilla Lame, MD;  Location: [facility];  Service: Endoscopy;  Laterality: N/A;  priority 4   HERNIA REPAIR     POLYPECTOMY N/A 01/03/2021   Procedure: POLYPECTOMY;  Surgeon: Lucilla Lame, MD;  Location: [facility];  Service: Endoscopy;  Laterality: N/A;    Social History   Tobacco Use   Smoking status: Never   Smokeless tobacco: Current    Types: Snuff  Vaping Use   Vaping Use: Never used  Substance Use Topics   Alcohol use: Yes    Alcohol/week: 49.0 standard drinks of alcohol    Types: 28 Glasses of wine, 21 Shots of liquor per week    Comment: 1 bottle wine/1/4 of a fifth of liquor daily   Drug use:  Never     Medication list has been reviewed and updated.  Current Meds  Medication Sig   amLODipine (NORVASC) 5 MG tablet TAKE 1 TABLET (5 MG TOTAL) BY MOUTH DAILY.   clobetasol ointment (TEMOVATE) 7.48 % Apply 1 application. topically 2 (two) times daily.   VITAMIN D PO Take by mouth daily.   [DISCONTINUED] cetirizine (ZYRTEC) 10 MG tablet Take by mouth daily.   [DISCONTINUED] triamcinolone (NASACORT) 55 MCG/ACT AERO nasal inhaler Place 2 sprays into the nose daily.       08/11/2022    3:42 PM 11/30/2021    8:03 AM 03/18/2021    8:21 AM 11/26/2020    8:02 AM  GAD 7 : Generalized  Anxiety Score  Nervous, Anxious, on Edge 0 0 0 0  Control/stop worrying 0 0 0 0  Worry too much - different things 0 0 0 0  Trouble relaxing 0 0 0 0  Restless 0 0 0 0  Easily annoyed or irritable 0 0 0 0  Afraid - awful might happen  0 0 0  Total GAD 7 Score  0 0 0  Anxiety Difficulty Not difficult at all Not difficult at all Not difficult at all        08/11/2022    3:41 PM 11/30/2021    8:03 AM 03/18/2021    8:21 AM  Depression screen PHQ 2/9  Decreased Interest 0 0 0  Down, Depressed, Hopeless 0 0 0  PHQ - 2 Score 0 0 0  Altered sleeping 0 0 0  Tired, decreased energy 2 0 0  Change in appetite 0 0 0  Feeling bad or failure about yourself  0 0 0  Trouble concentrating 0 0 0  Moving slowly or fidgety/restless 0 0 0  Suicidal thoughts 0 0 0  PHQ-9 Score 2 0 0  Difficult doing work/chores Not difficult at all Not difficult at all Not difficult at all    BP Readings from Last 3 Encounters:  08/11/22 110/76  03/21/22 139/80  11/30/21 (!) 138/100    Physical Exam Constitutional:      Appearance: Normal appearance.  Cardiovascular:     Rate and Rhythm: Normal rate and regular rhythm.     Heart sounds: No murmur heard. Pulmonary:     Effort: Pulmonary effort is normal.     Breath sounds: No wheezing or rhonchi.  Musculoskeletal:     Cervical back: Normal range of motion.     Right lower leg: No edema.     Left lower leg: No edema.  Lymphadenopathy:     Cervical: No cervical adenopathy.  Skin:    General: Skin is warm and dry.  Neurological:     General: No focal deficit present.     Mental Status: He is alert.  Psychiatric:        Mood and Affect: Mood normal.        Behavior: Behavior normal.     Wt Readings from Last 3 Encounters:  08/11/22 196 lb (88.9 kg)  11/30/21 189 lb (85.7 kg)  03/18/21 189 lb (85.7 kg)    BP 110/76   Pulse (!) 103   Ht _0  (1.803 m)   Wt 196 lb (88.9 kg)   SpO2 96%   BMI 27.34 kg/m   Assessment and Plan: 1. Essential  hypertension Continue amlodipine 5 mg per day Continue efforts at hydration.  2. Tachycardia, unspecified With presyncope. Labs workup is normal. Agree with Cardiology referral.   Partially  dictated using Editor, commissioning. Any errors are unintentional.  Halina Maidens, MD [facility]  08/11/2022

## 2022-08-21 ENCOUNTER — Telehealth: Payer: Self-pay | Admitting: Internal Medicine

## 2022-08-21 NOTE — Telephone Encounter (Signed)
Called pt stated he has an appt in January. KP

## 2022-08-21 NOTE — Telephone Encounter (Signed)
Copied from [place] [id number]. Topic: Referral - Status >> Aug 21, 2022 10:58 AM Chapman Fitch wrote: Reason for CRM: [facility] refereed pt to [facility] pt still has not heard from them /Dr. Army Melia advised pt to call her if he hasn't heard from them/ please advise

## 2022-08-26 ENCOUNTER — Other Ambulatory Visit: Payer: Self-pay | Admitting: Internal Medicine

## 2022-08-26 DIAGNOSIS — I10 Essential (primary) hypertension: Secondary | ICD-10-CM

## 2024-02-27 ENCOUNTER — Telehealth: Payer: Self-pay

## 2024-02-27 NOTE — Transitions of Care (Post Inpatient/ED Visit) (Signed)
   02/27/2024  Name: Matthew Velasquez MRN: [id number] DOB: Dec 20, 1965  Today's TOC FU Call Status: Today's TOC FU Call Status:: Successful TOC FU Call Completed TOC FU Call Complete Date: 02/27/24 Patient's Name and Date of Birth confirmed.  Transition Care Management Follow-up Telephone Call Date of Discharge: 02/25/24 Discharge Facility: Other ([facility]) Name of Other ([facility]) Discharge Facility: [facility] Type of Discharge: Emergency Department Reason for ED Visit: Cardiac Conditions Cardiac Conditions Diagnosis:  (Tachycardia, Lightheadedness) How have you been since you were released from the hospital?: Better Any questions or concerns?: No  Items Reviewed: Did you receive and understand the discharge instructions provided?: No Medications obtained,verified, and reconciled?: Yes (Medications Reviewed) Any new allergies since your discharge?: No Dietary orders reviewed?: NA Do you have support at home?: Yes  Medications Reviewed Today: Medications Reviewed Today   Medications were not reviewed in this encounter     Home Care and Equipment/Supplies: Were Home Health Services Ordered?: NA Any new equipment or medical supplies ordered?: NA  Functional Questionnaire: Do you need assistance with bathing/showering or dressing?: No Do you need assistance with meal preparation?: No Do you need assistance with eating?: No Do you have difficulty maintaining continence: No Do you need assistance with getting out of bed/getting out of a chair/moving?: No Do you have difficulty managing or taking your medications?: No  Follow up appointments reviewed: PCP Follow-up appointment confirmed?: NA Specialist Hospital Follow-up appointment confirmed?: Yes Date of Specialist follow-up appointment?: 02/29/24 Follow-Up Specialty Provider:: Cardiology- Pt is having a heart monitor placed Do you need transportation to your follow-up appointment?: No Do you understand care options if  your condition(s) worsen?: Yes-patient verbalized understanding    Matthew Velasquez  Primary Care & Sports Medicine at [facility] CMA, AAMA [address]  [place] [address] Office [phone]  Fax: [phone]
